# Patient Record
Sex: Female | Born: 1970
Health system: Southern US, Community
[De-identification: ages and names within clinical notes are randomized; demographics above are authoritative.]

## PROBLEM LIST (undated history)

## (undated) DIAGNOSIS — D239 Other benign neoplasm of skin, unspecified: Secondary | ICD-10-CM

## (undated) DIAGNOSIS — R002 Palpitations: Secondary | ICD-10-CM

## (undated) DIAGNOSIS — R011 Cardiac murmur, unspecified: Secondary | ICD-10-CM

## (undated) DIAGNOSIS — T7840XA Allergy, unspecified, initial encounter: Secondary | ICD-10-CM

## (undated) DIAGNOSIS — J45909 Unspecified asthma, uncomplicated: Secondary | ICD-10-CM

## (undated) HISTORY — DX: Allergy, unspecified, initial encounter: T78.40XA

## (undated) HISTORY — DX: Cardiac murmur, unspecified: R01.1

## (undated) HISTORY — DX: Other benign neoplasm of skin, unspecified: D23.9

## (undated) HISTORY — DX: Palpitations: R00.2

## (undated) HISTORY — DX: Unspecified asthma, uncomplicated: J45.909

## (undated) HISTORY — PX: REDUCTION MAMMAPLASTY: SUR839

---

## 1997-04-17 HISTORY — PX: CHOLECYSTECTOMY: SHX55

## 1998-04-17 HISTORY — PX: BREAST SURGERY: SHX581

## 2009-02-16 ENCOUNTER — Ambulatory Visit (HOSPITAL_COMMUNITY): Admission: RE | Admit: 2009-02-16 | Discharge: 2009-02-16 | Payer: Self-pay | Admitting: Gynecology

## 2010-04-17 HISTORY — PX: LIPOSUCTION: SHX10

## 2010-10-04 ENCOUNTER — Ambulatory Visit: Payer: Self-pay | Admitting: Surgery

## 2011-01-10 ENCOUNTER — Ambulatory Visit: Payer: Self-pay | Admitting: Surgery

## 2014-11-23 ENCOUNTER — Encounter (INDEPENDENT_AMBULATORY_CARE_PROVIDER_SITE_OTHER): Payer: Self-pay

## 2014-11-23 ENCOUNTER — Encounter: Payer: Self-pay | Admitting: Family Medicine

## 2014-11-23 ENCOUNTER — Ambulatory Visit (INDEPENDENT_AMBULATORY_CARE_PROVIDER_SITE_OTHER): Payer: BLUE CROSS/BLUE SHIELD | Admitting: Family Medicine

## 2014-11-23 VITALS — BP 111/72 | HR 88 | Temp 98.0°F | Resp 17 | Ht 66.0 in | Wt 145.8 lb

## 2014-11-23 DIAGNOSIS — R0982 Postnasal drip: Secondary | ICD-10-CM | POA: Diagnosis not present

## 2014-11-23 DIAGNOSIS — Z13220 Encounter for screening for lipoid disorders: Secondary | ICD-10-CM | POA: Diagnosis not present

## 2014-11-23 DIAGNOSIS — I493 Ventricular premature depolarization: Secondary | ICD-10-CM | POA: Diagnosis not present

## 2014-11-23 DIAGNOSIS — J309 Allergic rhinitis, unspecified: Secondary | ICD-10-CM | POA: Insufficient documentation

## 2014-11-23 DIAGNOSIS — Z111 Encounter for screening for respiratory tuberculosis: Secondary | ICD-10-CM

## 2014-11-23 DIAGNOSIS — H60542 Acute eczematoid otitis externa, left ear: Secondary | ICD-10-CM | POA: Diagnosis not present

## 2014-11-23 DIAGNOSIS — J45909 Unspecified asthma, uncomplicated: Secondary | ICD-10-CM | POA: Diagnosis not present

## 2014-11-23 MED ORDER — ACETIC ACID 2 % OT SOLN: 4.0000 [drp] | Freq: Three times a day (TID) | OTIC | Status: DC

## 2014-11-23 MED ORDER — TUBERCULIN PPD 5 UNIT/0.1ML ID SOLN
5.0000 [IU] | Freq: Once | INTRADERMAL | Status: AC
  Administered 2014-11-23: 5 [IU] via INTRADERMAL

## 2014-11-23 NOTE — Patient Instructions (Signed)
Premature Ventricular Contraction Premature ventricular contraction (PVC) is an irregularity of the heart rhythm involving extra or skipped heartbeats. In some cases, they may occur without obvious cause or heart disease. Other times, they can be caused by an electrolyte change in the blood. These need to be corrected. They can also be seen when there is not enough oxygen going to the heart. A common cause of this is plaque or cholesterol buildup. This buildup decreases the blood supply to the heart. In addition, extra beats may be caused or aggravated by:  Excessive smoking.  Alcohol consumption.  Caffeine.  Certain medications  Some street drugs. SYMPTOMS   The sensation of feeling your heart skipping a beat (palpitations).  In many cases, the person may have no symptoms. SIGNS AND TESTS   A physical examination may show an occasional irregularity, but if the PVC beats do not happen often, they may not be found on physical exam.  Blood pressure is usually normal.  Other tests that may find extra beats of the heart are:  An EKG (electrocardiogram)  A Holter monitor which can monitor your heart over longer periods of time  An Angiogram (study of the heart arteries). TREATMENT  Usually extra heartbeats do not need treatment. The condition is treated only if symptoms are severe or if extra beats are very frequent or are causing problems. An underlying cause, if discovered, may also require treatment.  Treatment may also be needed if there may be a risk for other more serious cardiac arrhythmias.  PREVENTION   Moderation in caffeine, alcohol, and tobacco use may reduce the risk of ectopic heartbeats in some people.  Exercise often helps people who lead a sedentary (inactive) lifestyle. PROGNOSIS  PVC heartbeats are generally harmless and do not need treatment.  RISKS AND COMPLICATIONS   Ventricular tachycardia (occasionally).  There usually are no complications.  Other  arrhythmias (occasionally). SEEK IMMEDIATE MEDICAL CARE IF:   You feel palpitations that are frequent or continual.  You develop chest pain or other problems such as shortness of breath, sweating, or nausea and vomiting.  You become light-headed or faint (pass out).  You get worse or do not improve with treatment. Document Released: 11/19/2003 Document Revised: 06/26/2011 Document Reviewed: 05/31/2007 [facility] Patient Information 2015 [place], [place]. This information is not intended to replace advice given to you by your health care provider. Make sure you discuss any questions you have with your health care provider.

## 2014-11-23 NOTE — Progress Notes (Signed)
Name: Erica Weber   MRN: [id number]    DOB: [birth year]   Date:11/23/2014       Progress Note  Subjective  Chief Complaint  Chief Complaint  Patient presents with  . Establish Care    NP  . Immunizations    TB Shot  . Asthma    HPI  Erica Weber is a pleasant 44 year old female who is here today to establish care for primary care services. She routinely sees a gynecologist and was urged by her specialist to discuss her cardiac findings.  Around 2012 had cardiology evaluation for asymptomatic irregular heart rhythm heard on exam, normal findings other than occasional PVCs which required no intervention at the time.  Again her gynecologist noted concern for irregular rhythm heard on exam recent. [name] denies chest pain, palpitations, SOB, dizziness, syncope, left arm numbness. She has an active lifestyle, exercises regularly with no limitations and eats a balanced diet. She does have a family history of strokes in her father as well as early MIs in her paternal grandparent(s).    In addition to this she is managing her allergic symptoms of sinus pressure, ear fulness with congestion well with daily claritin and singulair. She uses her flonase when she remembers to. Was using a lot of decongestants over the counter but due to heart rhythm she has really cut back as well as reduced her intake of caffeine.   Active Ambulatory Problems    Diagnosis Date Noted  . Asymptomatic PVCs 11/23/2014  . Allergic rhinitis with postnasal drip 11/23/2014  . Eczema of left external ear 11/23/2014  . Screening cholesterol level 11/23/2014  . Screening for tuberculosis 11/23/2014   Resolved Ambulatory Problems    Diagnosis Date Noted  . No Resolved Ambulatory Problems   Past Medical History  Diagnosis Date  . Allergy   . Asthma   . Heart murmur     History  Substance Use Topics  . Smoking status: Never Smoker   . Smokeless tobacco: Never Used  . Alcohol Use: 1.8 - 2.4 oz/week    3-4 Standard  drinks or equivalent per week     Current outpatient prescriptions:  .  Cetirizine HCl (ZYRTEC PO), Take 1 tablet by mouth at bedtime., Disp: , Rfl:  .  Montelukast Sodium (SINGULAIR PO), Take 1 tablet by mouth daily., Disp: , Rfl:  .  acetic acid (VOSOL) 2 % otic solution, Place 4 drops into the left ear 3 (three) times daily., Disp: 15 mL, Rfl: 1  Current facility-administered medications:  .  tuberculin injection 5 Units, 5 Units, Intradermal, Once, Bobetta Lime, MD, 5 Units at 11/23/14 1121  Past Surgical History  Procedure Laterality Date  . Cholecystectomy  1999  . Breast surgery Bilateral 2000    Breast Reduction  . Liposuction  2012    Fat deposit in back    Family History  Problem Relation Age of Onset  . Cancer Mother     lung  . Diabetes Mother   . Stroke Father   . Hyperlipidemia Father   . Down syndrome Son     No Known Allergies   Review of Systems  CONSTITUTIONAL: No significant weight changes, fever, chills, weakness or fatigue.  HEENT:  - Eyes: No visual changes.  - Ears: No auditory changes. No pain.  - Nose: No sneezing, congestion, runny nose. - Throat: No sore throat. No changes in swallowing. SKIN: No rash or itching.  CARDIOVASCULAR: No chest pain, chest pressure or chest  discomfort. No palpitations or edema.  RESPIRATORY: No shortness of breath, cough or sputum.  GASTROINTESTINAL: No anorexia, nausea, vomiting. No changes in bowel habits. No abdominal pain or blood.  GENITOURINARY: No dysuria. No frequency. No discharge.  NEUROLOGICAL: No headache, dizziness, syncope, paralysis, ataxia, numbness or tingling in the extremities. No memory changes. No change in bowel or bladder control.  MUSCULOSKELETAL: No joint pain. No muscle pain. HEMATOLOGIC: No anemia, bleeding or bruising.  LYMPHATICS: No enlarged lymph nodes.  PSYCHIATRIC: No change in mood. No change in sleep pattern.  ENDOCRINOLOGIC: No reports of sweating, cold or heat  intolerance. No polyuria or polydipsia.     Objective  BP 111/72 mmHg  Pulse 88  Temp(Src) 98 F (36.7 C) (Oral)  Resp 17  Ht 5\' 6"  (1.676 m)  Wt 145 lb 12.8 oz (66.134 kg)  BMI 23.54 kg/m2  SpO2 97%  LMP 11/19/2014 (Exact Date) Body mass index is 23.54 kg/(m^2).  Physical Exam  Constitutional: Patient appears well-developed and well-nourished. In no distress.  HEENT:  - Head: Normocephalic and atraumatic.  - Ears: Bilateral TMs gray, no erythema or effusion. Left external canal with scaling of skin. - Nose: Nasal mucosa moist - Mouth/Throat: Oropharynx is clear and moist. No tonsillar hypertrophy or erythema. No post nasal drainage.  - Eyes: Conjunctivae clear, EOM movements normal. PERRLA. No scleral icterus.  Neck: Normal range of motion. Neck supple. No JVD present. No thyromegaly present.  Cardiovascular: Normal rate, no murmurs. Rhythm however changes about every 3rd beat.  Pulmonary/Chest: Effort normal and breath sounds normal. No respiratory distress. Musculoskeletal: Normal range of motion bilateral UE and LE, no joint effusions. Peripheral vascular: Bilateral LE no edema. Neurological: CN II-XII grossly intact with no focal deficits. Alert and oriented to person, place, and time. Coordination, balance, strength, speech and gait are normal.  Skin: Skin is warm and dry. No rash noted. No erythema.  Psychiatric: Patient has a normal mood and affect. Behavior is normal in office today. Judgment and thought content normal in office today.   Assessment & Plan  1. Asymptomatic PVCs Cardiovascular risk factors minimal however EKG confirms frequent PVCs about every 3rd to 4th beat with T wave inversion in V1 and V2. Will start with blood work to rule out anemia, thyroid disorder, electrolyte imbalance and consult with cardiologist again, Dr. Ubaldo Glassing at [facility] has seen her several years ago.  - EKG 12-Lead - CBC with Differential/Platelet - Comprehensive  metabolic panel - TSH - T3, free - T4, free - Magnesium  2. Allergic rhinitis with postnasal drip Continue antihistamine and singulair.  3. Eczema of left external ear Trial of acetic acid solution to external ear canal.  - acetic acid (VOSOL) 2 % otic solution; Place 4 drops into the left ear 3 (three) times daily.  Dispense: 15 mL; Refill: 1  4. Screening cholesterol level  - Lipid panel  5. Screening for tuberculosis PPD placed, RTC 48hrs for reading.  - Tuberculin injection 5 units

## 2014-11-25 ENCOUNTER — Telehealth: Payer: Self-pay | Admitting: Family Medicine

## 2014-11-25 ENCOUNTER — Other Ambulatory Visit: Payer: Self-pay

## 2014-11-25 NOTE — Telephone Encounter (Signed)
Patient was prescribed Vosol. The pharmacy had to order the medication, however the wrong one was ordered. They ordered Vosol HC. Erica Weber would like to know if it is okay for them to give there the [facility] or if she has to have the regular Vosol. Please advise [phone]

## 2014-11-26 LAB — CBC WITH DIFFERENTIAL/PLATELET
BASOS ABS: 0 10*3/uL (ref 0.0–0.2)
Basos: 0 %
EOS (ABSOLUTE): 0.1 10*3/uL (ref 0.0–0.4)
Eos: 2 %
Hematocrit: 39.4 % (ref 34.0–46.6)
Hemoglobin: 13.6 g/dL (ref 11.1–15.9)
IMMATURE GRANS (ABS): 0 10*3/uL (ref 0.0–0.1)
IMMATURE GRANULOCYTES: 0 %
LYMPHS: 26 %
Lymphocytes Absolute: 1.5 10*3/uL (ref 0.7–3.1)
MCH: 31 pg (ref 26.6–33.0)
MCHC: 34.5 g/dL (ref 31.5–35.7)
MCV: 90 fL (ref 79–97)
Monocytes Absolute: 0.6 10*3/uL (ref 0.1–0.9)
Monocytes: 10 %
NEUTROS PCT: 62 %
Neutrophils Absolute: 3.5 10*3/uL (ref 1.4–7.0)
PLATELETS: 215 10*3/uL (ref 150–379)
RBC: 4.39 x10E6/uL (ref 3.77–5.28)
RDW: 13.2 % (ref 12.3–15.4)
WBC: 5.7 10*3/uL (ref 3.4–10.8)

## 2014-11-26 LAB — T4, FREE: Free T4: 1.16 ng/dL (ref 0.82–1.77)

## 2014-11-26 LAB — COMPREHENSIVE METABOLIC PANEL
ALK PHOS: 57 IU/L (ref 39–117)
ALT: 11 IU/L (ref 0–32)
AST: 17 IU/L (ref 0–40)
Albumin/Globulin Ratio: 2.6 — ABNORMAL HIGH (ref 1.1–2.5)
Albumin: 4.6 g/dL (ref 3.5–5.5)
BILIRUBIN TOTAL: 0.5 mg/dL (ref 0.0–1.2)
BUN / CREAT RATIO: 20 (ref 9–23)
BUN: 13 mg/dL (ref 6–24)
CHLORIDE: 100 mmol/L (ref 97–108)
CO2: 26 mmol/L (ref 18–29)
Calcium: 9.3 mg/dL (ref 8.7–10.2)
Creatinine, Ser: 0.64 mg/dL (ref 0.57–1.00)
GFR, EST AFRICAN AMERICAN: 125 mL/min/{1.73_m2} (ref >59)
GFR, EST NON AFRICAN AMERICAN: 108 mL/min/{1.73_m2} (ref >59)
Globulin, Total: 1.8 g/dL (ref 1.5–4.5)
Glucose: 85 mg/dL (ref 65–99)
POTASSIUM: 4.1 mmol/L (ref 3.5–5.2)
Sodium: 140 mmol/L (ref 134–144)
Total Protein: 6.4 g/dL (ref 6.0–8.5)

## 2014-11-26 LAB — LIPID PANEL
CHOLESTEROL TOTAL: 142 mg/dL (ref 100–199)
Chol/HDL Ratio: 2.6 ratio units (ref 0.0–4.4)
HDL: 55 mg/dL (ref >39)
LDL Calculated: 73 mg/dL (ref 0–99)
Triglycerides: 71 mg/dL (ref 0–149)
VLDL CHOLESTEROL CAL: 14 mg/dL (ref 5–40)

## 2014-11-26 LAB — T3, FREE: T3, Free: 3.1 pg/mL (ref 2.0–4.4)

## 2014-11-26 LAB — TSH: TSH: 2.52 u[IU]/mL (ref 0.450–4.500)

## 2014-11-26 LAB — MAGNESIUM: Magnesium: 1.9 mg/dL (ref 1.6–2.3)

## 2014-11-26 NOTE — Telephone Encounter (Signed)
Yes it is okay to use the Vosol HC instead of the Vosol.

## 2014-11-27 NOTE — Telephone Encounter (Signed)
Pharmacy informed.

## 2014-12-01 DIAGNOSIS — R0789 Other chest pain: Secondary | ICD-10-CM | POA: Insufficient documentation

## 2014-12-01 DIAGNOSIS — Z87898 Personal history of other specified conditions: Secondary | ICD-10-CM | POA: Insufficient documentation

## 2015-07-22 DIAGNOSIS — J3089 Other allergic rhinitis: Secondary | ICD-10-CM | POA: Diagnosis not present

## 2015-07-22 DIAGNOSIS — J301 Allergic rhinitis due to pollen: Secondary | ICD-10-CM | POA: Diagnosis not present

## 2015-07-27 DIAGNOSIS — J301 Allergic rhinitis due to pollen: Secondary | ICD-10-CM | POA: Diagnosis not present

## 2015-07-27 DIAGNOSIS — J3089 Other allergic rhinitis: Secondary | ICD-10-CM | POA: Diagnosis not present

## 2015-08-05 DIAGNOSIS — J3089 Other allergic rhinitis: Secondary | ICD-10-CM | POA: Diagnosis not present

## 2015-08-05 DIAGNOSIS — J301 Allergic rhinitis due to pollen: Secondary | ICD-10-CM | POA: Diagnosis not present

## 2015-08-10 DIAGNOSIS — J301 Allergic rhinitis due to pollen: Secondary | ICD-10-CM | POA: Diagnosis not present

## 2015-08-10 DIAGNOSIS — J3089 Other allergic rhinitis: Secondary | ICD-10-CM | POA: Diagnosis not present

## 2015-08-17 DIAGNOSIS — J301 Allergic rhinitis due to pollen: Secondary | ICD-10-CM | POA: Diagnosis not present

## 2015-08-17 DIAGNOSIS — J3089 Other allergic rhinitis: Secondary | ICD-10-CM | POA: Diagnosis not present

## 2015-08-26 DIAGNOSIS — J3089 Other allergic rhinitis: Secondary | ICD-10-CM | POA: Diagnosis not present

## 2015-08-26 DIAGNOSIS — J301 Allergic rhinitis due to pollen: Secondary | ICD-10-CM | POA: Diagnosis not present

## 2015-09-06 DIAGNOSIS — J301 Allergic rhinitis due to pollen: Secondary | ICD-10-CM | POA: Diagnosis not present

## 2015-09-06 DIAGNOSIS — J3089 Other allergic rhinitis: Secondary | ICD-10-CM | POA: Diagnosis not present

## 2015-09-16 DIAGNOSIS — J3089 Other allergic rhinitis: Secondary | ICD-10-CM | POA: Diagnosis not present

## 2015-09-16 DIAGNOSIS — J301 Allergic rhinitis due to pollen: Secondary | ICD-10-CM | POA: Diagnosis not present

## 2015-09-22 DIAGNOSIS — J3089 Other allergic rhinitis: Secondary | ICD-10-CM | POA: Diagnosis not present

## 2015-09-22 DIAGNOSIS — J301 Allergic rhinitis due to pollen: Secondary | ICD-10-CM | POA: Diagnosis not present

## 2015-10-07 DIAGNOSIS — J301 Allergic rhinitis due to pollen: Secondary | ICD-10-CM | POA: Diagnosis not present

## 2015-10-07 DIAGNOSIS — J3089 Other allergic rhinitis: Secondary | ICD-10-CM | POA: Diagnosis not present

## 2015-10-14 DIAGNOSIS — J301 Allergic rhinitis due to pollen: Secondary | ICD-10-CM | POA: Diagnosis not present

## 2015-10-14 DIAGNOSIS — J3089 Other allergic rhinitis: Secondary | ICD-10-CM | POA: Diagnosis not present

## 2015-11-12 DIAGNOSIS — J3089 Other allergic rhinitis: Secondary | ICD-10-CM | POA: Diagnosis not present

## 2015-11-12 DIAGNOSIS — J301 Allergic rhinitis due to pollen: Secondary | ICD-10-CM | POA: Diagnosis not present

## 2015-11-18 DIAGNOSIS — J301 Allergic rhinitis due to pollen: Secondary | ICD-10-CM | POA: Diagnosis not present

## 2015-11-18 DIAGNOSIS — J3089 Other allergic rhinitis: Secondary | ICD-10-CM | POA: Diagnosis not present

## 2015-12-23 DIAGNOSIS — J3089 Other allergic rhinitis: Secondary | ICD-10-CM | POA: Diagnosis not present

## 2015-12-23 DIAGNOSIS — J301 Allergic rhinitis due to pollen: Secondary | ICD-10-CM | POA: Diagnosis not present

## 2015-12-30 DIAGNOSIS — J301 Allergic rhinitis due to pollen: Secondary | ICD-10-CM | POA: Diagnosis not present

## 2015-12-30 DIAGNOSIS — J3089 Other allergic rhinitis: Secondary | ICD-10-CM | POA: Diagnosis not present

## 2016-01-06 DIAGNOSIS — J301 Allergic rhinitis due to pollen: Secondary | ICD-10-CM | POA: Diagnosis not present

## 2016-01-06 DIAGNOSIS — J3089 Other allergic rhinitis: Secondary | ICD-10-CM | POA: Diagnosis not present

## 2016-01-27 DIAGNOSIS — J301 Allergic rhinitis due to pollen: Secondary | ICD-10-CM | POA: Diagnosis not present

## 2016-01-27 DIAGNOSIS — J3089 Other allergic rhinitis: Secondary | ICD-10-CM | POA: Diagnosis not present

## 2016-02-10 DIAGNOSIS — J301 Allergic rhinitis due to pollen: Secondary | ICD-10-CM | POA: Diagnosis not present

## 2016-02-10 DIAGNOSIS — J3089 Other allergic rhinitis: Secondary | ICD-10-CM | POA: Diagnosis not present

## 2016-02-17 ENCOUNTER — Encounter: Payer: BLUE CROSS/BLUE SHIELD | Admitting: Family Medicine

## 2016-02-17 DIAGNOSIS — Z1231 Encounter for screening mammogram for malignant neoplasm of breast: Secondary | ICD-10-CM | POA: Diagnosis not present

## 2016-02-17 DIAGNOSIS — Z01419 Encounter for gynecological examination (general) (routine) without abnormal findings: Secondary | ICD-10-CM | POA: Diagnosis not present

## 2016-02-17 DIAGNOSIS — Z6824 Body mass index (BMI) 24.0-24.9, adult: Secondary | ICD-10-CM | POA: Diagnosis not present

## 2016-02-23 ENCOUNTER — Other Ambulatory Visit: Payer: Self-pay | Admitting: Obstetrics & Gynecology

## 2016-02-23 DIAGNOSIS — R928 Other abnormal and inconclusive findings on diagnostic imaging of breast: Secondary | ICD-10-CM

## 2016-02-24 DIAGNOSIS — J301 Allergic rhinitis due to pollen: Secondary | ICD-10-CM | POA: Diagnosis not present

## 2016-02-24 DIAGNOSIS — J3089 Other allergic rhinitis: Secondary | ICD-10-CM | POA: Diagnosis not present

## 2016-03-02 ENCOUNTER — Ambulatory Visit
Admission: RE | Admit: 2016-03-02 | Discharge: 2016-03-02 | Disposition: A | Discharge status: Home / Self Care | Payer: BLUE CROSS/BLUE SHIELD | Source: Ambulatory Visit | Attending: Obstetrics & Gynecology | Admitting: Obstetrics & Gynecology

## 2016-03-02 ENCOUNTER — Other Ambulatory Visit: Payer: Self-pay | Admitting: Obstetrics & Gynecology

## 2016-03-02 DIAGNOSIS — R928 Other abnormal and inconclusive findings on diagnostic imaging of breast: Secondary | ICD-10-CM

## 2016-03-02 DIAGNOSIS — J3089 Other allergic rhinitis: Secondary | ICD-10-CM | POA: Diagnosis not present

## 2016-03-02 DIAGNOSIS — N6323 Unspecified lump in the left breast, lower outer quadrant: Secondary | ICD-10-CM | POA: Diagnosis not present

## 2016-03-02 DIAGNOSIS — R921 Mammographic calcification found on diagnostic imaging of breast: Secondary | ICD-10-CM | POA: Diagnosis not present

## 2016-03-02 DIAGNOSIS — J301 Allergic rhinitis due to pollen: Secondary | ICD-10-CM | POA: Diagnosis not present

## 2016-03-06 ENCOUNTER — Ambulatory Visit
Admission: RE | Admit: 2016-03-06 | Discharge: 2016-03-06 | Disposition: A | Discharge status: Home / Self Care | Payer: BLUE CROSS/BLUE SHIELD | Source: Ambulatory Visit | Attending: Obstetrics & Gynecology | Admitting: Obstetrics & Gynecology

## 2016-03-06 ENCOUNTER — Other Ambulatory Visit: Payer: Self-pay | Admitting: Obstetrics & Gynecology

## 2016-03-06 DIAGNOSIS — N6011 Diffuse cystic mastopathy of right breast: Secondary | ICD-10-CM | POA: Diagnosis not present

## 2016-03-06 DIAGNOSIS — R928 Other abnormal and inconclusive findings on diagnostic imaging of breast: Secondary | ICD-10-CM

## 2016-03-06 DIAGNOSIS — R921 Mammographic calcification found on diagnostic imaging of breast: Secondary | ICD-10-CM | POA: Diagnosis not present

## 2016-03-06 HISTORY — PX: BREAST BIOPSY: SHX20

## 2016-03-16 DIAGNOSIS — J3081 Allergic rhinitis due to animal (cat) (dog) hair and dander: Secondary | ICD-10-CM | POA: Diagnosis not present

## 2016-03-16 DIAGNOSIS — J3089 Other allergic rhinitis: Secondary | ICD-10-CM | POA: Diagnosis not present

## 2016-03-16 DIAGNOSIS — J301 Allergic rhinitis due to pollen: Secondary | ICD-10-CM | POA: Diagnosis not present

## 2016-03-16 DIAGNOSIS — L309 Dermatitis, unspecified: Secondary | ICD-10-CM | POA: Diagnosis not present

## 2016-03-17 ENCOUNTER — Encounter: Payer: BLUE CROSS/BLUE SHIELD | Admitting: Family Medicine

## 2016-03-28 DIAGNOSIS — J301 Allergic rhinitis due to pollen: Secondary | ICD-10-CM | POA: Diagnosis not present

## 2016-03-28 DIAGNOSIS — J3089 Other allergic rhinitis: Secondary | ICD-10-CM | POA: Diagnosis not present

## 2016-03-30 DIAGNOSIS — J301 Allergic rhinitis due to pollen: Secondary | ICD-10-CM | POA: Diagnosis not present

## 2016-04-04 DIAGNOSIS — J3089 Other allergic rhinitis: Secondary | ICD-10-CM | POA: Diagnosis not present

## 2016-04-04 DIAGNOSIS — J301 Allergic rhinitis due to pollen: Secondary | ICD-10-CM | POA: Diagnosis not present

## 2016-04-06 ENCOUNTER — Encounter: Payer: Self-pay | Admitting: Family Medicine

## 2016-04-06 ENCOUNTER — Ambulatory Visit (INDEPENDENT_AMBULATORY_CARE_PROVIDER_SITE_OTHER): Payer: BLUE CROSS/BLUE SHIELD | Admitting: Family Medicine

## 2016-04-06 VITALS — BP 110/72 | HR 79 | Temp 98.0°F | Resp 16 | Ht 66.0 in | Wt 152.0 lb

## 2016-04-06 DIAGNOSIS — Z111 Encounter for screening for respiratory tuberculosis: Secondary | ICD-10-CM

## 2016-04-06 DIAGNOSIS — Z0000 Encounter for general adult medical examination without abnormal findings: Secondary | ICD-10-CM

## 2016-04-06 DIAGNOSIS — R5383 Other fatigue: Secondary | ICD-10-CM | POA: Diagnosis not present

## 2016-04-06 DIAGNOSIS — Z114 Encounter for screening for human immunodeficiency virus [HIV]: Secondary | ICD-10-CM

## 2016-04-06 DIAGNOSIS — Z23 Encounter for immunization: Secondary | ICD-10-CM | POA: Diagnosis not present

## 2016-04-06 LAB — CBC WITH DIFFERENTIAL/PLATELET
BASOS PCT: 0 %
Basophils Absolute: 0 cells/uL (ref 0–200)
EOS ABS: 72 {cells}/uL (ref 15–500)
EOS PCT: 1 %
HCT: 42.4 % (ref 35.0–45.0)
Hemoglobin: 14.1 g/dL (ref 11.7–15.5)
LYMPHS PCT: 20 %
Lymphs Abs: 1440 cells/uL (ref 850–3900)
MCH: 30.8 pg (ref 27.0–33.0)
MCHC: 33.3 g/dL (ref 32.0–36.0)
MCV: 92.6 fL (ref 80.0–100.0)
MONOS PCT: 8 %
MPV: 9.6 fL (ref 7.5–12.5)
Monocytes Absolute: 576 cells/uL (ref 200–950)
NEUTROS ABS: 5112 {cells}/uL (ref 1500–7800)
Neutrophils Relative %: 71 %
PLATELETS: 250 10*3/uL (ref 140–400)
RBC: 4.58 MIL/uL (ref 3.80–5.10)
RDW: 13.3 % (ref 11.0–15.0)
WBC: 7.2 10*3/uL (ref 3.8–10.8)

## 2016-04-06 LAB — TSH: TSH: 1.62 mIU/L

## 2016-04-06 LAB — LIPID PANEL
CHOL/HDL RATIO: 2.9 ratio (ref <5.0)
Cholesterol: 150 mg/dL (ref <200)
HDL: 51 mg/dL (ref >50)
LDL CALC: 74 mg/dL (ref <100)
Triglycerides: 123 mg/dL (ref <150)
VLDL: 25 mg/dL (ref <30)

## 2016-04-06 LAB — COMPLETE METABOLIC PANEL WITH GFR
ALBUMIN: 4.3 g/dL (ref 3.6–5.1)
ALK PHOS: 44 U/L (ref 33–115)
ALT: 11 U/L (ref 6–29)
AST: 18 U/L (ref 10–35)
BUN: 8 mg/dL (ref 7–25)
CHLORIDE: 100 mmol/L (ref 98–110)
CO2: 27 mmol/L (ref 20–31)
Calcium: 9.5 mg/dL (ref 8.6–10.2)
Creat: 0.64 mg/dL (ref 0.50–1.10)
GFR, Est African American: >89 mL/min (ref >=60)
GLUCOSE: 80 mg/dL (ref 65–99)
POTASSIUM: 5.1 mmol/L (ref 3.5–5.3)
SODIUM: 137 mmol/L (ref 135–146)
Total Bilirubin: 0.5 mg/dL (ref 0.2–1.2)
Total Protein: 6.8 g/dL (ref 6.1–8.1)

## 2016-04-06 LAB — VITAMIN B12: VITAMIN B 12: 278 pg/mL (ref 200–1100)

## 2016-04-06 NOTE — Progress Notes (Signed)
Patient ID: Erica Weber, female   DOB: 1970-10-25, 45 y.o.   MRN: [id number]   Subjective:   Erica Weber is a 45 y.o. female here for a complete physical exam  Interim issues since last visit: she saw a heart doctor Her GYN heard some skipped beats; PVC; had a heart murmur and used to have to premedicate for dental procedures; really bad allergies, used to take Sudafed; did EKG; saw Dr. Ubaldo Glassing and happened again, had coffee and it happened again; had a stress test; disappear when she exercises; not on any medicines; half a cup of coffee at times, does not stress her out  USPSTF grade A and B recommendations Alcohol: social Depression:  Depression screen [facility] 2/9 04/06/2016 11/23/2014  Decreased Interest 0 0  Down, Depressed, Hopeless 0 0  PHQ - 2 Score 0 0   Hypertension: excellent Obesity: no Tobacco use: remote never HIV, hep B, hep C: get today STD testing and prevention (chl/gon/syphilis): declined Lipids: not fasting, little Debbie christmas cake Glucose: nonfasting Colorectal cancer: no first degrees Breast cancer: per GYN BRCA gene screening: per GYN Intimate partner violence: no Cervical cancer screening: per GYN Lung cancer: no Osteoporosis: no Fall prevention/vitamin D: wants to check AAA: n/a Aspirin: n/a Diet: a little better than typical [place], not fast food, very little red meat Exercise: trying Skin cancer: no worrisome moles; would get them checked yearly; has a spot on shoulder; had a few dysplastic nevi, mild dysplasia, did wider resection; avoid tanning, wears sunscreen Tired and dry skin  Past Medical History:  Diagnosis Date  . Allergy   . Asthma   . Heart murmur    Past Surgical History:  Procedure Laterality Date  . BREAST SURGERY Bilateral 2000   Breast Reduction  . CHOLECYSTECTOMY  1999  . LIPOSUCTION  2012   Fat deposit in back  MD note: had lipoma resection, not liposuction  Family History  Problem Relation Age of Onset  . Cancer  Mother     lung  . Diabetes Mother   . Stroke Father   . Hyperlipidemia Father   . Down syndrome Son    Social History  Substance Use Topics  . Smoking status: Never Smoker  . Smokeless tobacco: Never Used  . Alcohol use 1.8 - 2.4 oz/week    3 - 4 Standard drinks or equivalent per week   Review of Systems  Constitutional: Positive for fatigue and unexpected weight change.   Objective:   Vitals:   04/06/16 1114  BP: 110/72  Pulse: 79  Resp: 16  Temp: 98 F (36.7 C)  TempSrc: Oral  SpO2: 99%  Weight: 152 lb (68.9 kg)  Height: 5' 6"  (1.676 m)   Body mass index is 24.53 kg/m. Wt Readings from Last 3 Encounters:  04/06/16 152 lb (68.9 kg)  11/23/14 145 lb 12.8 oz (66.1 kg)   Physical Exam  Constitutional: She appears well-developed and well-nourished.  HENT:  Head: Normocephalic and atraumatic.  Right Ear: Hearing, tympanic membrane, external ear and ear canal normal.  Left Ear: Hearing, tympanic membrane, external ear and ear canal normal.  Eyes: Conjunctivae and EOM are normal. Right eye exhibits no hordeolum. Left eye exhibits no hordeolum. No scleral icterus.  Neck: Carotid bruit is not present. No thyromegaly present.  Cardiovascular: Normal rate, regular rhythm, S1 normal, S2 normal and normal heart sounds.   No extrasystoles are present.  Pulmonary/Chest: Effort normal and breath sounds normal. No respiratory distress.  Abdominal: Soft. Normal  appearance and bowel sounds are normal. She exhibits no distension, no abdominal bruit, no pulsatile midline mass and no mass. There is no hepatosplenomegaly. There is no tenderness. No hernia.  Musculoskeletal: Normal range of motion. She exhibits no edema.  Lymphadenopathy:       Head (right side): No submandibular adenopathy present.       Head (left side): No submandibular adenopathy present.    She has no cervical adenopathy.  Neurological: She is alert. She displays no tremor. No cranial nerve deficit. She exhibits  normal muscle tone. Gait normal.  Reflex Scores:      Patellar reflexes are 2+ on the right side and 2+ on the left side. Skin: Skin is warm and dry. No bruising and no ecchymosis noted. She is not diaphoretic. No cyanosis. No pallor.  Psychiatric: Her speech is normal and behavior is normal. Thought content normal. Her mood appears not anxious. She does not exhibit a depressed mood.    Assessment/Plan:   Problem List Items Addressed This Visit      Other   Preventative health care - Primary    USPSTF grade A and B recommendations reviewed with patient; age-appropriate recommendations, preventive care, screening tests, etc discussed and encouraged; healthy living encouraged; see AVS for patient education given to patient       Relevant Orders   TSH (Completed)   Lipid panel (Completed)   CBC with Differential/Platelet (Completed)   COMPLETE METABOLIC PANEL WITH GFR (Completed)   Fatigue    Check TSH and vit D      Relevant Orders   Vitamin B12 (Completed)   VITAMIN D 25 Hydroxy (Vit-D Deficiency, Fractures) (Completed)    Other Visit Diagnoses    Needs flu shot       Relevant Orders   Flu Vaccine QUAD 36+ mos PF IM (Fluarix & Fluzone Quad PF) (Completed)   Encounter for screening for HIV       Relevant Orders   HIV antibody (with reflex) (Completed)   Screening-pulmonary TB       Relevant Orders   Quantiferon tb gold assay (Completed)      Meds ordered this encounter  Medications  . fluticasone (FLONASE) 50 MCG/ACT nasal spray    Sig: Place 1 spray into both nostrils daily.  Marland Kitchen loratadine (CLARITIN) 10 MG tablet    Sig: Take 10 mg by mouth daily as needed for allergies.   Orders Placed This Encounter  Procedures  . Flu Vaccine QUAD 36+ mos PF IM (Fluarix & Fluzone Quad PF)  . HIV antibody (with reflex)  . TSH  . Lipid panel  . CBC with Differential/Platelet  . Vitamin B12  . VITAMIN D 25 Hydroxy (Vit-D Deficiency, Fractures)  . COMPLETE METABOLIC PANEL WITH  GFR  . Quantiferon tb gold assay    Follow up plan: Return in about 1 year (around 04/06/2017) for complete physical.  An After Visit Summary was printed and given to the patient.

## 2016-04-06 NOTE — Patient Instructions (Addendum)
Let's get labs today If you have not heard anything from my staff in a week about any orders/referrals/studies from today, please contact us here to follow-up ([phone]) [phone]  Health Maintenance, Female Introduction Adopting a healthy lifestyle and getting preventive care can go a long way to promote health and wellness. Talk with your health care provider about what schedule of regular examinations is right for you. This is a good chance for you to check in with your provider about disease prevention and staying healthy. In between checkups, there are plenty of things you can do on your own. Experts have done a lot of research about which lifestyle changes and preventive measures are most likely to keep you healthy. Ask your health care provider for more information. Weight and diet Eat a healthy diet  Be sure to include plenty of vegetables, fruits, low-fat dairy products, and lean protein.  Do not eat a lot of foods high in solid fats, added sugars, or salt.  Get regular exercise. This is one of the most important things you can do for your health.  Most adults should exercise for at least 150 minutes each week. The exercise should increase your heart rate and make you sweat (moderate-intensity exercise).  Most adults should also do strengthening exercises at least twice a week. This is in addition to the moderate-intensity exercise. Maintain a healthy weight  Body mass index (BMI) is a measurement that can be used to identify possible weight problems. It estimates body fat based on height and weight. Your health care provider can help determine your BMI and help you achieve or maintain a healthy weight.  For females 33 years of age and older:  A BMI below 18.5 is considered underweight.  A BMI of 18.5 to 24.9 is normal.  A BMI of 25 to 29.9 is considered overweight.  A BMI of 30 and above is considered obese. Watch levels of cholesterol and blood lipids  You should start having  your blood tested for lipids and cholesterol at 46 years of age, then have this test every 5 years.  You may need to have your cholesterol levels checked more often if:  Your lipid or cholesterol levels are high.  You are older than 46 years of age.  You are at high risk for heart disease. Cancer screening Lung Cancer  Lung cancer screening is recommended for adults 37-79 years old who are at high risk for lung cancer because of a history of smoking.  A yearly low-dose CT scan of the lungs is recommended for people who:  Currently smoke.  Have quit within the past 15 years.  Have at least a 30-pack-year history of smoking. A pack year is smoking an average of one pack of cigarettes a day for 1 year.  Yearly screening should continue until it has been 15 years since you quit.  Yearly screening should stop if you develop a health problem that would prevent you from having lung cancer treatment. Breast Cancer  Practice breast self-awareness. This means understanding how your breasts normally appear and feel.  It also means doing regular breast self-exams. Let your health care provider know about any changes, no matter how small.  If you are in your 20s or 30s, you should have a clinical breast exam (CBE) by a health care provider every 1-3 years as part of a regular health exam.  If you are 76 or older, have a CBE every year. Also consider having a breast X-ray (mammogram) every  year.  If you have a family history of breast cancer, talk to your health care provider about genetic screening.  If you are at high risk for breast cancer, talk to your health care provider about having an MRI and a mammogram every year.  Breast cancer gene (BRCA) assessment is recommended for women who have family members with BRCA-related cancers. BRCA-related cancers include:  Breast.  Ovarian.  Tubal.  Peritoneal cancers.  Results of the assessment will determine the need for genetic  counseling and BRCA1 and BRCA2 testing. Cervical Cancer  Your health care provider may recommend that you be screened regularly for cancer of the pelvic organs (ovaries, uterus, and vagina). This screening involves a pelvic examination, including checking for microscopic changes to the surface of your cervix (Pap test). You may be encouraged to have this screening done every 3 years, beginning at age 62.  For women ages 54-65, health care providers may recommend pelvic exams and Pap testing every 3 years, or they may recommend the Pap and pelvic exam, combined with testing for human papilloma virus (HPV), every 5 years. Some types of HPV increase your risk of cervical cancer. Testing for HPV may also be done on women of any age with unclear Pap test results.  Other health care providers may not recommend any screening for nonpregnant women who are considered low risk for pelvic cancer and who do not have symptoms. Ask your health care provider if a screening pelvic exam is right for you.  If you have had past treatment for cervical cancer or a condition that could lead to cancer, you need Pap tests and screening for cancer for at least 20 years after your treatment. If Pap tests have been discontinued, your risk factors (such as having a new sexual partner) need to be reassessed to determine if screening should resume. Some women have medical problems that increase the chance of getting cervical cancer. In these cases, your health care provider may recommend more frequent screening and Pap tests. Colorectal Cancer  This type of cancer can be detected and often prevented.  Routine colorectal cancer screening usually begins at 45 years of age and continues through 46 years of age.  Your health care provider may recommend screening at an earlier age if you have risk factors for colon cancer.  Your health care provider may also recommend using home test kits to check for hidden blood in the stool.  A  small camera at the end of a tube can be used to examine your colon directly (sigmoidoscopy or colonoscopy). This is done to check for the earliest forms of colorectal cancer.  Routine screening usually begins at age 34.  Direct examination of the colon should be repeated every 5-10 years through 46 years of age. However, you may need to be screened more often if early forms of precancerous polyps or small growths are found. Skin Cancer  Check your skin from head to toe regularly.  Tell your health care provider about any new moles or changes in moles, especially if there is a change in a mole's shape or color.  Also tell your health care provider if you have a mole that is larger than the size of a pencil eraser.  Always use sunscreen. Apply sunscreen liberally and repeatedly throughout the day.  Protect yourself by wearing long sleeves, pants, a wide-brimmed hat, and sunglasses whenever you are outside. Heart disease, diabetes, and high blood pressure  High blood pressure causes heart disease and increases  the risk of stroke. High blood pressure is more likely to develop in:  People who have blood pressure in the high end of the normal range (130-139/85-89 mm Hg).  People who are overweight or obese.  People who are African American.  If you are 27-27 years of age, have your blood pressure checked every 3-5 years. If you are 94 years of age or older, have your blood pressure checked every year. You should have your blood pressure measured twice-once when you are at a hospital or clinic, and once when you are not at a hospital or clinic. Record the average of the two measurements. To check your blood pressure when you are not at a hospital or clinic, you can use:  An automated blood pressure machine at a pharmacy.  A home blood pressure monitor.  If you are between 50 years and 38 years old, ask your health care provider if you should take aspirin to prevent strokes.  Have regular  diabetes screenings. This involves taking a blood sample to check your fasting blood sugar level.  If you are at a normal weight and have a low risk for diabetes, have this test once every three years after 46 years of age.  If you are overweight and have a high risk for diabetes, consider being tested at a younger age or more often. Preventing infection Hepatitis B  If you have a higher risk for hepatitis B, you should be screened for this virus. You are considered at high risk for hepatitis B if:  You were born in a country where hepatitis B is common. Ask your health care provider which countries are considered high risk.  Your parents were born in a high-risk country, and you have not been immunized against hepatitis B (hepatitis B vaccine).  You have HIV or AIDS.  You use needles to inject street drugs.  You live with someone who has hepatitis B.  You have had sex with someone who has hepatitis B.  You get hemodialysis treatment.  You take certain medicines for conditions, including cancer, organ transplantation, and autoimmune conditions. Hepatitis C  Blood testing is recommended for:  Everyone born from [birth year] through 1966.  Anyone with known risk factors for hepatitis C. Sexually transmitted infections (STIs)  You should be screened for sexually transmitted infections (STIs) including gonorrhea and chlamydia if:  You are sexually active and are younger than 46 years of age.  You are older than 46 years of age and your health care provider tells you that you are at risk for this type of infection.  Your sexual activity has changed since you were last screened and you are at an increased risk for chlamydia or gonorrhea. Ask your health care provider if you are at risk.  If you do not have HIV, but are at risk, it may be recommended that you take a prescription medicine daily to prevent HIV infection. This is called pre-exposure prophylaxis (PrEP). You are considered at  risk if:  You are sexually active and do not regularly use condoms or know the HIV status of your partner(s).  You take drugs by injection.  You are sexually active with a partner who has HIV. Talk with your health care provider about whether you are at high risk of being infected with HIV. If you choose to begin PrEP, you should first be tested for HIV. You should then be tested every 3 months for as long as you are taking PrEP. Pregnancy  If  you are premenopausal and you may become pregnant, ask your health care provider about preconception counseling.  If you may become pregnant, take 400 to 800 micrograms (mcg) of folic acid every day.  If you want to prevent pregnancy, talk to your health care provider about birth control (contraception). Osteoporosis and menopause  Osteoporosis is a disease in which the bones lose minerals and strength with aging. This can result in serious bone fractures. Your risk for osteoporosis can be identified using a bone density scan.  If you are 77 years of age or older, or if you are at risk for osteoporosis and fractures, ask your health care provider if you should be screened.  Ask your health care provider whether you should take a calcium or vitamin D supplement to lower your risk for osteoporosis.  Menopause may have certain physical symptoms and risks.  Hormone replacement therapy may reduce some of these symptoms and risks. Talk to your health care provider about whether hormone replacement therapy is right for you. Follow these instructions at home:  Schedule regular health, dental, and eye exams.  Stay current with your immunizations.  Do not use any tobacco products including cigarettes, chewing tobacco, or electronic cigarettes.  If you are pregnant, do not drink alcohol.  If you are breastfeeding, limit how much and how often you drink alcohol.  Limit alcohol intake to no more than 1 drink per day for nonpregnant women. One drink  equals 12 ounces of beer, 5 ounces of wine, or 1 ounces of hard liquor.  Do not use street drugs.  Do not share needles.  Ask your health care provider for help if you need support or information about quitting drugs.  Tell your health care provider if you often feel depressed.  Tell your health care provider if you have ever been abused or do not feel safe at home. This information is not intended to replace advice given to you by your health care provider. Make sure you discuss any questions you have with your health care provider. Document Released: 10/17/2010 Document Revised: 09/09/2015 Document Reviewed: 01/05/2015  2017 [employer]vier

## 2016-04-06 NOTE — Assessment & Plan Note (Signed)
Check TSH and vit D 

## 2016-04-06 NOTE — Assessment & Plan Note (Signed)
USPSTF grade A and B recommendations reviewed with patient; age-appropriate recommendations, preventive care, screening tests, etc discussed and encouraged; healthy living encouraged; see AVS for patient education given to patient  

## 2016-04-07 ENCOUNTER — Other Ambulatory Visit: Payer: Self-pay | Admitting: Family Medicine

## 2016-04-07 DIAGNOSIS — E538 Deficiency of other specified B group vitamins: Secondary | ICD-10-CM | POA: Insufficient documentation

## 2016-04-07 DIAGNOSIS — E559 Vitamin D deficiency, unspecified: Secondary | ICD-10-CM | POA: Insufficient documentation

## 2016-04-07 LAB — VITAMIN D 25 HYDROXY (VIT D DEFICIENCY, FRACTURES): Vit D, 25-Hydroxy: 14 ng/mL — ABNORMAL LOW (ref 30–100)

## 2016-04-07 LAB — HIV ANTIBODY (ROUTINE TESTING W REFLEX): HIV: NONREACTIVE

## 2016-04-07 MED ORDER — VITAMIN D (ERGOCALCIFEROL) 1.25 MG (50000 UNIT) PO CAPS: 50000.0000 [IU] | ORAL_CAPSULE | ORAL | 1 refills | Status: DC

## 2016-04-07 NOTE — Assessment & Plan Note (Signed)
Start OTC 

## 2016-04-07 NOTE — Assessment & Plan Note (Signed)
Start Rx 

## 2016-04-07 NOTE — Progress Notes (Signed)
rx for vit D sent 

## 2016-04-11 LAB — QUANTIFERON TB GOLD ASSAY (BLOOD)
INTERFERON GAMMA RELEASE ASSAY: NEGATIVE
Mitogen-Nil: 7.89 IU/mL
QUANTIFERON TB AG MINUS NIL: 0 [IU]/mL
Quantiferon Nil Value: 0.01 IU/mL

## 2016-04-19 DIAGNOSIS — J019 Acute sinusitis, unspecified: Secondary | ICD-10-CM | POA: Diagnosis not present

## 2016-04-27 DIAGNOSIS — J301 Allergic rhinitis due to pollen: Secondary | ICD-10-CM | POA: Diagnosis not present

## 2016-04-27 DIAGNOSIS — J3089 Other allergic rhinitis: Secondary | ICD-10-CM | POA: Diagnosis not present

## 2016-05-02 DIAGNOSIS — J301 Allergic rhinitis due to pollen: Secondary | ICD-10-CM | POA: Diagnosis not present

## 2016-05-02 DIAGNOSIS — J3089 Other allergic rhinitis: Secondary | ICD-10-CM | POA: Diagnosis not present

## 2016-05-11 DIAGNOSIS — J301 Allergic rhinitis due to pollen: Secondary | ICD-10-CM | POA: Diagnosis not present

## 2016-05-11 DIAGNOSIS — J3089 Other allergic rhinitis: Secondary | ICD-10-CM | POA: Diagnosis not present

## 2016-05-11 DIAGNOSIS — J3081 Allergic rhinitis due to animal (cat) (dog) hair and dander: Secondary | ICD-10-CM | POA: Diagnosis not present

## 2016-05-18 DIAGNOSIS — J3089 Other allergic rhinitis: Secondary | ICD-10-CM | POA: Diagnosis not present

## 2016-05-18 DIAGNOSIS — J301 Allergic rhinitis due to pollen: Secondary | ICD-10-CM | POA: Diagnosis not present

## 2016-05-30 ENCOUNTER — Other Ambulatory Visit: Payer: Self-pay | Admitting: Family Medicine

## 2016-05-30 NOTE — Telephone Encounter (Signed)
She's requesting refill, so will use 50k once a month and have her not take any OTC vit D; notes on sig and to pharmacist

## 2016-06-01 DIAGNOSIS — J3089 Other allergic rhinitis: Secondary | ICD-10-CM | POA: Diagnosis not present

## 2016-06-01 DIAGNOSIS — J301 Allergic rhinitis due to pollen: Secondary | ICD-10-CM | POA: Diagnosis not present

## 2016-06-01 DIAGNOSIS — J3081 Allergic rhinitis due to animal (cat) (dog) hair and dander: Secondary | ICD-10-CM | POA: Diagnosis not present

## 2016-06-15 DIAGNOSIS — J3089 Other allergic rhinitis: Secondary | ICD-10-CM | POA: Diagnosis not present

## 2016-06-15 DIAGNOSIS — J301 Allergic rhinitis due to pollen: Secondary | ICD-10-CM | POA: Diagnosis not present

## 2016-06-20 DIAGNOSIS — J3089 Other allergic rhinitis: Secondary | ICD-10-CM | POA: Diagnosis not present

## 2016-06-29 DIAGNOSIS — J301 Allergic rhinitis due to pollen: Secondary | ICD-10-CM | POA: Diagnosis not present

## 2016-06-29 DIAGNOSIS — J3089 Other allergic rhinitis: Secondary | ICD-10-CM | POA: Diagnosis not present

## 2016-07-06 DIAGNOSIS — J301 Allergic rhinitis due to pollen: Secondary | ICD-10-CM | POA: Diagnosis not present

## 2016-07-06 DIAGNOSIS — J3089 Other allergic rhinitis: Secondary | ICD-10-CM | POA: Diagnosis not present

## 2016-07-13 DIAGNOSIS — J3089 Other allergic rhinitis: Secondary | ICD-10-CM | POA: Diagnosis not present

## 2016-07-13 DIAGNOSIS — J301 Allergic rhinitis due to pollen: Secondary | ICD-10-CM | POA: Diagnosis not present

## 2016-07-13 DIAGNOSIS — J3081 Allergic rhinitis due to animal (cat) (dog) hair and dander: Secondary | ICD-10-CM | POA: Diagnosis not present

## 2016-07-27 DIAGNOSIS — J301 Allergic rhinitis due to pollen: Secondary | ICD-10-CM | POA: Diagnosis not present

## 2016-07-27 DIAGNOSIS — J3089 Other allergic rhinitis: Secondary | ICD-10-CM | POA: Diagnosis not present

## 2016-08-03 DIAGNOSIS — J301 Allergic rhinitis due to pollen: Secondary | ICD-10-CM | POA: Diagnosis not present

## 2016-08-03 DIAGNOSIS — J3089 Other allergic rhinitis: Secondary | ICD-10-CM | POA: Diagnosis not present

## 2016-08-17 DIAGNOSIS — J301 Allergic rhinitis due to pollen: Secondary | ICD-10-CM | POA: Diagnosis not present

## 2016-08-17 DIAGNOSIS — J3081 Allergic rhinitis due to animal (cat) (dog) hair and dander: Secondary | ICD-10-CM | POA: Diagnosis not present

## 2016-08-17 DIAGNOSIS — J3089 Other allergic rhinitis: Secondary | ICD-10-CM | POA: Diagnosis not present

## 2016-08-24 DIAGNOSIS — J3089 Other allergic rhinitis: Secondary | ICD-10-CM | POA: Diagnosis not present

## 2016-08-24 DIAGNOSIS — J301 Allergic rhinitis due to pollen: Secondary | ICD-10-CM | POA: Diagnosis not present

## 2016-08-31 DIAGNOSIS — J301 Allergic rhinitis due to pollen: Secondary | ICD-10-CM | POA: Diagnosis not present

## 2016-08-31 DIAGNOSIS — J3089 Other allergic rhinitis: Secondary | ICD-10-CM | POA: Diagnosis not present

## 2016-08-31 DIAGNOSIS — J3081 Allergic rhinitis due to animal (cat) (dog) hair and dander: Secondary | ICD-10-CM | POA: Diagnosis not present

## 2016-09-14 DIAGNOSIS — J301 Allergic rhinitis due to pollen: Secondary | ICD-10-CM | POA: Diagnosis not present

## 2016-09-14 DIAGNOSIS — J3089 Other allergic rhinitis: Secondary | ICD-10-CM | POA: Diagnosis not present

## 2016-09-29 DIAGNOSIS — J301 Allergic rhinitis due to pollen: Secondary | ICD-10-CM | POA: Diagnosis not present

## 2016-09-29 DIAGNOSIS — J3089 Other allergic rhinitis: Secondary | ICD-10-CM | POA: Diagnosis not present

## 2016-10-02 DIAGNOSIS — Z86018 Personal history of other benign neoplasm: Secondary | ICD-10-CM | POA: Diagnosis not present

## 2016-10-02 DIAGNOSIS — L853 Xerosis cutis: Secondary | ICD-10-CM | POA: Diagnosis not present

## 2016-10-02 DIAGNOSIS — D485 Neoplasm of uncertain behavior of skin: Secondary | ICD-10-CM | POA: Diagnosis not present

## 2016-10-13 DIAGNOSIS — J301 Allergic rhinitis due to pollen: Secondary | ICD-10-CM | POA: Diagnosis not present

## 2016-10-13 DIAGNOSIS — J3089 Other allergic rhinitis: Secondary | ICD-10-CM | POA: Diagnosis not present

## 2016-10-31 ENCOUNTER — Other Ambulatory Visit: Payer: Self-pay | Admitting: Family Medicine

## 2016-10-31 NOTE — Telephone Encounter (Signed)
Left a detail messaged  

## 2016-10-31 NOTE — Telephone Encounter (Signed)
Patient was only supposed to take vit D Rx once a week for 8 weeks, then 1,000 iu vitamin D3 once a day (OTC); thank you

## 2016-11-06 ENCOUNTER — Other Ambulatory Visit: Payer: Self-pay | Admitting: Obstetrics & Gynecology

## 2016-11-06 DIAGNOSIS — R921 Mammographic calcification found on diagnostic imaging of breast: Secondary | ICD-10-CM

## 2016-11-09 DIAGNOSIS — J3089 Other allergic rhinitis: Secondary | ICD-10-CM | POA: Diagnosis not present

## 2016-11-09 DIAGNOSIS — J301 Allergic rhinitis due to pollen: Secondary | ICD-10-CM | POA: Diagnosis not present

## 2016-11-15 ENCOUNTER — Ambulatory Visit
Admission: RE | Admit: 2016-11-15 | Discharge: 2016-11-15 | Disposition: A | Discharge status: Home / Self Care | Payer: BLUE CROSS/BLUE SHIELD | Source: Ambulatory Visit | Attending: Obstetrics & Gynecology | Admitting: Obstetrics & Gynecology

## 2016-11-15 DIAGNOSIS — R921 Mammographic calcification found on diagnostic imaging of breast: Secondary | ICD-10-CM | POA: Diagnosis not present

## 2016-11-27 DIAGNOSIS — D229 Melanocytic nevi, unspecified: Secondary | ICD-10-CM | POA: Diagnosis not present

## 2016-11-27 DIAGNOSIS — D485 Neoplasm of uncertain behavior of skin: Secondary | ICD-10-CM | POA: Diagnosis not present

## 2016-12-08 DIAGNOSIS — J301 Allergic rhinitis due to pollen: Secondary | ICD-10-CM | POA: Diagnosis not present

## 2016-12-08 DIAGNOSIS — J3089 Other allergic rhinitis: Secondary | ICD-10-CM | POA: Diagnosis not present

## 2016-12-22 DIAGNOSIS — J3081 Allergic rhinitis due to animal (cat) (dog) hair and dander: Secondary | ICD-10-CM | POA: Diagnosis not present

## 2016-12-22 DIAGNOSIS — J3089 Other allergic rhinitis: Secondary | ICD-10-CM | POA: Diagnosis not present

## 2016-12-22 DIAGNOSIS — J301 Allergic rhinitis due to pollen: Secondary | ICD-10-CM | POA: Diagnosis not present

## 2016-12-26 DIAGNOSIS — J301 Allergic rhinitis due to pollen: Secondary | ICD-10-CM | POA: Diagnosis not present

## 2016-12-26 DIAGNOSIS — J3089 Other allergic rhinitis: Secondary | ICD-10-CM | POA: Diagnosis not present

## 2017-01-02 DIAGNOSIS — J3089 Other allergic rhinitis: Secondary | ICD-10-CM | POA: Diagnosis not present

## 2017-01-02 DIAGNOSIS — J301 Allergic rhinitis due to pollen: Secondary | ICD-10-CM | POA: Diagnosis not present

## 2017-01-12 DIAGNOSIS — J301 Allergic rhinitis due to pollen: Secondary | ICD-10-CM | POA: Diagnosis not present

## 2017-01-12 DIAGNOSIS — J3089 Other allergic rhinitis: Secondary | ICD-10-CM | POA: Diagnosis not present

## 2017-01-19 DIAGNOSIS — J301 Allergic rhinitis due to pollen: Secondary | ICD-10-CM | POA: Diagnosis not present

## 2017-01-19 DIAGNOSIS — J3089 Other allergic rhinitis: Secondary | ICD-10-CM | POA: Diagnosis not present

## 2017-01-30 DIAGNOSIS — J301 Allergic rhinitis due to pollen: Secondary | ICD-10-CM | POA: Diagnosis not present

## 2017-01-30 DIAGNOSIS — J3089 Other allergic rhinitis: Secondary | ICD-10-CM | POA: Diagnosis not present

## 2017-02-06 DIAGNOSIS — J3089 Other allergic rhinitis: Secondary | ICD-10-CM | POA: Diagnosis not present

## 2017-02-06 DIAGNOSIS — J301 Allergic rhinitis due to pollen: Secondary | ICD-10-CM | POA: Diagnosis not present

## 2017-02-27 DIAGNOSIS — J3081 Allergic rhinitis due to animal (cat) (dog) hair and dander: Secondary | ICD-10-CM | POA: Diagnosis not present

## 2017-02-27 DIAGNOSIS — J3089 Other allergic rhinitis: Secondary | ICD-10-CM | POA: Diagnosis not present

## 2017-02-27 DIAGNOSIS — J301 Allergic rhinitis due to pollen: Secondary | ICD-10-CM | POA: Diagnosis not present

## 2017-03-20 DIAGNOSIS — J3081 Allergic rhinitis due to animal (cat) (dog) hair and dander: Secondary | ICD-10-CM | POA: Diagnosis not present

## 2017-03-20 DIAGNOSIS — J3089 Other allergic rhinitis: Secondary | ICD-10-CM | POA: Diagnosis not present

## 2017-03-20 DIAGNOSIS — J301 Allergic rhinitis due to pollen: Secondary | ICD-10-CM | POA: Diagnosis not present

## 2017-03-27 DIAGNOSIS — J3081 Allergic rhinitis due to animal (cat) (dog) hair and dander: Secondary | ICD-10-CM | POA: Diagnosis not present

## 2017-03-27 DIAGNOSIS — L309 Dermatitis, unspecified: Secondary | ICD-10-CM | POA: Diagnosis not present

## 2017-03-27 DIAGNOSIS — J3089 Other allergic rhinitis: Secondary | ICD-10-CM | POA: Diagnosis not present

## 2017-03-27 DIAGNOSIS — J301 Allergic rhinitis due to pollen: Secondary | ICD-10-CM | POA: Diagnosis not present

## 2017-03-31 ENCOUNTER — Encounter: Payer: Self-pay | Admitting: Family Medicine

## 2017-04-02 DIAGNOSIS — J3081 Allergic rhinitis due to animal (cat) (dog) hair and dander: Secondary | ICD-10-CM | POA: Diagnosis not present

## 2017-04-02 DIAGNOSIS — J301 Allergic rhinitis due to pollen: Secondary | ICD-10-CM | POA: Diagnosis not present

## 2017-04-03 DIAGNOSIS — J301 Allergic rhinitis due to pollen: Secondary | ICD-10-CM | POA: Diagnosis not present

## 2017-04-03 DIAGNOSIS — J3081 Allergic rhinitis due to animal (cat) (dog) hair and dander: Secondary | ICD-10-CM | POA: Diagnosis not present

## 2017-04-03 DIAGNOSIS — J3089 Other allergic rhinitis: Secondary | ICD-10-CM | POA: Diagnosis not present

## 2017-04-11 ENCOUNTER — Encounter: Payer: BLUE CROSS/BLUE SHIELD | Admitting: Family Medicine

## 2017-04-12 DIAGNOSIS — J301 Allergic rhinitis due to pollen: Secondary | ICD-10-CM | POA: Diagnosis not present

## 2017-04-12 DIAGNOSIS — J3081 Allergic rhinitis due to animal (cat) (dog) hair and dander: Secondary | ICD-10-CM | POA: Diagnosis not present

## 2017-04-12 DIAGNOSIS — J3089 Other allergic rhinitis: Secondary | ICD-10-CM | POA: Diagnosis not present

## 2017-04-27 ENCOUNTER — Ambulatory Visit (INDEPENDENT_AMBULATORY_CARE_PROVIDER_SITE_OTHER): Payer: BLUE CROSS/BLUE SHIELD | Admitting: Family Medicine

## 2017-04-27 ENCOUNTER — Encounter: Payer: Self-pay | Admitting: Family Medicine

## 2017-04-27 VITALS — BP 112/72 | HR 96 | Temp 98.1°F | Resp 14 | Wt 148.8 lb

## 2017-04-27 DIAGNOSIS — Z823 Family history of stroke: Secondary | ICD-10-CM

## 2017-04-27 DIAGNOSIS — Z0000 Encounter for general adult medical examination without abnormal findings: Secondary | ICD-10-CM | POA: Diagnosis not present

## 2017-04-27 DIAGNOSIS — Z0184 Encounter for antibody response examination: Secondary | ICD-10-CM

## 2017-04-27 DIAGNOSIS — J3489 Other specified disorders of nose and nasal sinuses: Secondary | ICD-10-CM | POA: Diagnosis not present

## 2017-04-27 DIAGNOSIS — Z111 Encounter for screening for respiratory tuberculosis: Secondary | ICD-10-CM | POA: Diagnosis not present

## 2017-04-27 DIAGNOSIS — D239 Other benign neoplasm of skin, unspecified: Secondary | ICD-10-CM

## 2017-04-27 DIAGNOSIS — Z23 Encounter for immunization: Secondary | ICD-10-CM

## 2017-04-27 DIAGNOSIS — Z0001 Encounter for general adult medical examination with abnormal findings: Secondary | ICD-10-CM

## 2017-04-27 DIAGNOSIS — E538 Deficiency of other specified B group vitamins: Secondary | ICD-10-CM

## 2017-04-27 DIAGNOSIS — E559 Vitamin D deficiency, unspecified: Secondary | ICD-10-CM | POA: Diagnosis not present

## 2017-04-27 HISTORY — DX: Other benign neoplasm of skin, unspecified: D23.9

## 2017-04-27 MED ORDER — MUPIROCIN CALCIUM 2 % EX CREA: 1.0000 "application " | TOPICAL_CREAM | Freq: Two times a day (BID) | CUTANEOUS | 0 refills | Status: DC

## 2017-04-27 NOTE — Assessment & Plan Note (Signed)
USPSTF grade A and B recommendations reviewed with patient; age-appropriate recommendations, preventive care, screening tests, etc discussed and encouraged; healthy living encouraged; see AVS for patient education given to patient  

## 2017-04-27 NOTE — Assessment & Plan Note (Signed)
Recheck and supplement if needed

## 2017-04-27 NOTE — Progress Notes (Signed)
Patient ID: [name], female   DOB: 16-Nov-1970, 47 y.o.   MRN: [id number]   Subjective:   Ambriella Kitt Weber is a 47 y.o. female here for a complete physical exam  Interim issues since last visit: no medical excitement  USPSTF grade A and B recommendations Depression:  Depression screen [facility] 2/9 04/27/2017 04/06/2016 11/23/2014  Decreased Interest 0 0 0  Down, Depressed, Hopeless 0 0 0  PHQ - 2 Score 0 0 0   Hypertension: BP Readings from Last 3 Encounters:  04/27/17 112/72  04/06/16 110/72  11/23/14 111/72   Obesity: Wt Readings from Last 3 Encounters:  04/27/17 148 lb 12.8 oz (67.5 kg)  04/06/16 152 lb (68.9 kg)  11/23/14 145 lb 12.8 oz (66.1 kg)   BMI Readings from Last 3 Encounters:  04/27/17 24.02 kg/m  04/06/16 24.53 kg/m  11/23/14 23.53 kg/m     Skin cancer: no worrisome moles; goes to see derm yearly; hx of dysplastic nevus Lung cancer:  nonsmoker Breast cancer: through GYN; getting yearly mammograms; have had biopsy, benign Colorectal cancer: no family hx; start at 84, discussed controversy  BRCA gene screening: family hx of breast and/or ovarian cancer and/or metastatic prostate cancer? MGM had breast cancer as an old women; PGM had breast cancer in her 66s or 84s, metastasized to bones; PGM's brother had breast cancer; has not talked to anyone about BRCA; father does not have cancer to her knowledge, nursing home Cervical cancer screening: through GYN HIV, hep B, hep C: check HIV, would like to be tested for Hep B immunity STD testing and prevention (chl/gon/syphilis): not interested Intimate partner violence: no abuse Contraception: nothing Osteoporosis: no hx of steroids Fall prevention/vitamin D: discussed Vit D and B12 deficiency; taking few times a week Diet: fruits and veggies Exercise: needs to work on that she says; discussed 6,000 steps a day Alcohol: less than 7 Tobacco use: nonsmoker Aspirin:n/a Lipids: nonfasting Lab Results  Component  Value Date   CHOL 150 04/06/2016   CHOL 142 11/25/2014   Lab Results  Component Value Date   HDL 51 04/06/2016   HDL 55 11/25/2014   Lab Results  Component Value Date   LDLCALC 74 04/06/2016   [place] 73 11/25/2014   Lab Results  Component Value Date   TRIG 123 04/06/2016   TRIG 71 11/25/2014   Lab Results  Component Value Date   CHOLHDL 2.9 04/06/2016   CHOLHDL 2.6 11/25/2014   No results found for: LDLDIRECT Glucose:  Glucose  Date Value Ref Range Status  11/25/2014 85 65 - 99 mg/dL Final   Glucose, Bld  Date Value Ref Range Status  04/06/2016 80 65 - 99 mg/dL Final   AAA: n/a Concerned about stroke; in the back of her mind about NSAIDs; father has had multiple strokes, as well as his father and cousin  In her left nostril, she has a crusty place, constant    Past Medical History:  Diagnosis Date  . Allergy   . Asthma   . Dysplastic nevus 04/27/2017   Right leg, left shoulder, left thigh, sees dermatologist, Dr. Ree Edman  . Heart murmur    Past Surgical History:  Procedure Laterality Date  . BREAST BIOPSY Right 03/06/2016   benign  . BREAST SURGERY Bilateral 2000   Breast Reduction  . CHOLECYSTECTOMY  1999  . LIPOSUCTION  2012   Fat deposit in back  . REDUCTION MAMMAPLASTY Bilateral    1999   Family History  Problem Relation  Age of Onset  . Cancer Mother        lung  . Diabetes Mother   . Stroke Father   . Hyperlipidemia Father   . Down syndrome Son   . Breast cancer Maternal Grandmother   . Breast cancer Paternal Grandmother    Social History   Tobacco Use  . Smoking status: Never Smoker  . Smokeless tobacco: Never Used  Substance Use Topics  . Alcohol use: Yes    Alcohol/week: 1.8 - 2.4 oz    Types: 3 - 4 Standard drinks or equivalent per week  . Drug use: No   Review of Systems  Objective:   Vitals:   04/27/17 1116  BP: 112/72  Pulse: 96  Resp: 14  Temp: 98.1 F (36.7 C)  TempSrc: Oral  SpO2: 97%  Weight:  148 lb 12.8 oz (67.5 kg)   Body mass index is 24.02 kg/m. Wt Readings from Last 3 Encounters:  04/27/17 148 lb 12.8 oz (67.5 kg)  04/06/16 152 lb (68.9 kg)  11/23/14 145 lb 12.8 oz (66.1 kg)   Physical Exam  Constitutional: She appears well-developed and well-nourished.  HENT:  Head: Normocephalic and atraumatic.  Right Ear: Hearing, tympanic membrane, external ear and ear canal normal.  Left Ear: Hearing, tympanic membrane, external ear and ear canal normal.  Mouth/Throat: Oropharynx is clear and moist and mucous membranes are normal.  Sore in the vestibule superior aspect LEFT nostril; no bleeding  Eyes: Conjunctivae and EOM are normal. Right eye exhibits no hordeolum. Left eye exhibits no hordeolum. No scleral icterus.  Neck: Carotid bruit is not present. No thyromegaly present.  Cardiovascular: Normal rate, regular rhythm, S1 normal, S2 normal and normal heart sounds.  No extrasystoles are present.  Pulmonary/Chest: Effort normal and breath sounds normal. No respiratory distress.  Abdominal: Soft. Normal appearance and bowel sounds are normal. She exhibits no distension, no abdominal bruit, no pulsatile midline mass and no mass. There is no hepatosplenomegaly. There is no tenderness. No hernia.  Musculoskeletal: Normal range of motion. She exhibits no edema.       Feet:  Small (less than a pea-sized) knot on the proximal aspect of the LEFT first MTP  Lymphadenopathy:       Head (right side): No submandibular adenopathy present.       Head (left side): No submandibular adenopathy present.    She has no cervical adenopathy.  Neurological: She is alert. She displays no tremor. No cranial nerve deficit. She exhibits normal muscle tone. Gait normal.  Reflex Scores:      Patellar reflexes are 2+ on the right side and 2+ on the left side. Skin: Skin is warm and dry. No bruising and no ecchymosis noted. No cyanosis. No pallor.  Psychiatric: Her speech is normal and behavior is normal.  Thought content normal. Her mood appears not anxious. She does not exhibit a depressed mood.   Assessment/Plan:   Problem List Items Addressed This Visit      Other   Screening for tuberculosis   Relevant Orders   QuantiFERON-TB Gold Plus   Vitamin D deficiency    Check vit D level, 800 iu a few times a day unless instructed otherwise      Relevant Orders   VITAMIN D 25 Hydroxy (Vit-D Deficiency, Fractures)   Vitamin B12 deficiency    Recheck and supplement if needed      Relevant Orders   B12   Preventative health care - Primary    USPSTF  grade A and B recommendations reviewed with patient; age-appropriate recommendations, preventive care, screening tests, etc discussed and encouraged; healthy living encouraged; see AVS for patient education given to patient       Relevant Orders   CBC with Differential/Platelet   COMPLETE METABOLIC PANEL WITH GFR   Lipid panel   TSH   Dysplastic nevus    Sees dermatologist       Other Visit Diagnoses    Needs flu shot       Relevant Orders   Flu Vaccine QUAD 6+ mos PF IM (Fluarix Quad PF) (Completed)   Need for diphtheria-tetanus-pertussis (Tdap) vaccine       Relevant Orders   Tdap vaccine greater than or equal to 7yo IM (Completed)   Immunity status testing       Relevant Orders   Hepatitis B Surface AntiBODY   Hepatitis B Surface AntiGEN   Family history of stroke       discussed with patient; she has had some testing, not sure about MTHFR testing though; if she wants referral to neuro, let me know; see AVS   Sore in nostril       encouraged patient to see her ENT; will try mupirocin first      Meds ordered this encounter  Medications  . mupirocin cream (BACTROBAN) 2 %    Sig: Apply 1 application topically 2 (two) times daily.    Dispense:  15 g    Refill:  0    Either cream or ointment is okay   Orders Placed This Encounter  Procedures  . Flu Vaccine QUAD 6+ mos PF IM (Fluarix Quad PF)  . Tdap vaccine greater  than or equal to 7yo IM  . Hepatitis B Surface AntiBODY  . Hepatitis B Surface AntiGEN  . CBC with Differential/Platelet  . COMPLETE METABOLIC PANEL WITH GFR  . Lipid panel  . TSH  . VITAMIN D 25 Hydroxy (Vit-D Deficiency, Fractures)  . B12  . QuantiFERON-TB Gold Plus    Follow up plan: Return in about 1 year (around 04/27/2018) for complete physical.  An After Visit Summary was printed and given to the patient.

## 2017-04-27 NOTE — Assessment & Plan Note (Signed)
Check vit D level, 800 iu a few times a day unless instructed otherwise

## 2017-04-27 NOTE — Assessment & Plan Note (Signed)
Sees dermatologist

## 2017-04-27 NOTE — Patient Instructions (Addendum)
Talk to your gynecologist about your family history of breast cancer, especially in that great uncle I'll recommend 800 iu of vitamin D  You received the flu shot today; it should protect you against the flu virus over the coming months; it will take about two weeks for antibodies to develop; do try to stay away from hospitals, nursing homes, and daycares during peak flu season; taking extra vitamin C daily during flu season may help you avoid getting sick  You received the vaccine to protect against tetanus and diphtheria and pertussis today; the tetanus and diphtheria portions will provide protection up to ten years, and the pertussis component will give you protection against whooping cough for life   Stroke Prevention Some medical conditions and behaviors are associated with a higher chance of having a stroke. You can help prevent a stroke by making nutrition, lifestyle, and other changes, including managing any medical conditions you may have. What nutrition changes can be made?  Eat healthy foods. You can do this by: ? Choosing foods high in fiber, such as fresh fruits and vegetables and whole grains. ? Eating at least 5 or more servings of fruits and vegetables a day. Try to fill half of your plate at each meal with fruits and vegetables. ? Choosing lean protein foods, such as lean cuts of meat, poultry without skin, fish, tofu, beans, and nuts. ? Eating low-fat dairy products. ? Avoiding foods that are high in salt (sodium). This can help lower blood pressure. ? Avoiding foods that have saturated fat, trans fat, and cholesterol. This can help prevent high cholesterol. ? Avoiding processed and premade foods.  Follow your health care provider's specific guidelines for losing weight, controlling high blood pressure (hypertension), lowering high cholesterol, and managing diabetes. These may include: ? Reducing your daily calorie intake. ? Limiting your daily sodium intake to 1,500  milligrams (mg). ? Using only healthy fats for cooking, such as olive oil, canola oil, or sunflower oil. ? Counting your daily carbohydrate intake. What lifestyle changes can be made?  Maintain a healthy weight. Talk to your health care provider about your ideal weight.  Get at least 30 minutes of moderate physical activity at least 5 days a week. Moderate activity includes brisk walking, biking, and swimming.  Do not use any products that contain nicotine or tobacco, such as cigarettes and e-cigarettes. If you need help quitting, ask your health care provider. It may also be helpful to avoid exposure to secondhand smoke.  Limit alcohol intake to no more than 1 drink a day for nonpregnant women and 2 drinks a day for men. One drink equals 12 oz of beer, 5 oz of wine, or 1 oz of hard liquor.  Stop any illegal drug use.  Avoid taking birth control pills. Talk to your health care provider about the risks of taking birth control pills if: ? You are over 39 years old. ? You smoke. ? You get migraines. ? You have ever had a blood clot. What other changes can be made?  Manage your cholesterol levels. ? Eating a healthy diet is important for preventing high cholesterol. If cholesterol cannot be managed through diet alone, you may also need to take medicines. ? Take any prescribed medicines to control your cholesterol as told by your health care provider.  Manage your diabetes. ? Eating a healthy diet and exercising regularly are important parts of managing your blood sugar. If your blood sugar cannot be managed through diet and exercise, you may  need to take medicines. ? Take any prescribed medicines to control your diabetes as told by your health care provider.  Control your hypertension. ? To reduce your risk of stroke, try to keep your blood pressure below 130/80. ? Eating a healthy diet and exercising regularly are an important part of controlling your blood pressure. If your blood  pressure cannot be managed through diet and exercise, you may need to take medicines. ? Take any prescribed medicines to control hypertension as told by your health care provider. ? Ask your health care provider if you should monitor your blood pressure at home. ? Have your blood pressure checked every year, even if your blood pressure is normal. Blood pressure increases with age and some medical conditions.  Get evaluated for sleep disorders (sleep apnea). Talk to your health care provider about getting a sleep evaluation if you snore a lot or have excessive sleepiness.  Take over-the-counter and prescription medicines only as told by your health care provider. Aspirin or blood thinners (antiplatelets or anticoagulants) may be recommended to reduce your risk of forming blood clots that can lead to stroke.  Make sure that any other medical conditions you have, such as atrial fibrillation or atherosclerosis, are managed. What are the warning signs of a stroke? The warning signs of a stroke can be easily remembered as BEFAST.  B is for balance. Signs include: ? Dizziness. ? Loss of balance or coordination. ? Sudden trouble walking.  E is for eyes. Signs include: ? A sudden change in vision. ? Trouble seeing.  F is for face. Signs include: ? Sudden weakness or numbness of the face. ? The face or eyelid drooping to one side.  A is for arms. Signs include: ? Sudden weakness or numbness of the arm, usually on one side of the body.  S is for speech. Signs include: ? Trouble speaking (aphasia). ? Trouble understanding.  T is for time. ? These symptoms may represent a serious problem that is an emergency. Do not wait to see if the symptoms will go away. Get medical help right away. Call your local emergency services (911 in the [place] Do not drive yourself to the hospital.  Other signs of stroke may include: ? A sudden, severe headache with no known cause. ? Nausea or  vomiting. ? Seizure.  Where to find more information: For more information, visit:  American Stroke Association: www.strokeassociation.org  National Stroke Association: www.stroke.org  Summary  You can prevent a stroke by eating healthy, exercising, not smoking, limiting alcohol intake, and managing any medical conditions you may have.  Do not use any products that contain nicotine or tobacco, such as cigarettes and e-cigarettes. If you need help quitting, ask your health care provider. It may also be helpful to avoid exposure to secondhand smoke.  Remember BEFAST for warning signs of stroke. Get help right away if you or a loved one has any of these signs. This information is not intended to replace advice given to you by your health care provider. Make sure you discuss any questions you have with your health care provider. Document Released: 05/11/2004 Document Revised: 05/09/2016 Document Reviewed: 05/09/2016 Elsevier Interactive Patient Education  2018 [place] Maintenance, Female Adopting a healthy lifestyle and getting preventive care can go a long way to promote health and wellness. Talk with your health care provider about what schedule of regular examinations is right for you. This is a good chance for you to check in with your provider about  disease prevention and staying healthy. In between checkups, there are plenty of things you can do on your own. Experts have done a lot of research about which lifestyle changes and preventive measures are most likely to keep you healthy. Ask your health care provider for more information. Weight and diet Eat a healthy diet  Be sure to include plenty of vegetables, fruits, low-fat dairy products, and lean protein.  Do not eat a lot of foods high in solid fats, added sugars, or salt.  Get regular exercise. This is one of the most important things you can do for your health. ? Most adults should exercise for at least 150  minutes each week. The exercise should increase your heart rate and make you sweat (moderate-intensity exercise). ? Most adults should also do strengthening exercises at least twice a week. This is in addition to the moderate-intensity exercise.  Maintain a healthy weight  Body mass index (BMI) is a measurement that can be used to identify possible weight problems. It estimates body fat based on height and weight. Your health care provider can help determine your BMI and help you achieve or maintain a healthy weight.  For females 36 years of age and older: ? A BMI below 18.5 is considered underweight. ? A BMI of 18.5 to 24.9 is normal. ? A BMI of 25 to 29.9 is considered overweight. ? A BMI of 30 and above is considered obese.  Watch levels of cholesterol and blood lipids  You should start having your blood tested for lipids and cholesterol at 48 years of age, then have this test every 5 years.  You may need to have your cholesterol levels checked more often if: ? Your lipid or cholesterol levels are high. ? You are older than 48 years of age. ? You are at high risk for heart disease.  Cancer screening Lung Cancer  Lung cancer screening is recommended for adults 33-80 years old who are at high risk for lung cancer because of a history of smoking.  A yearly low-dose CT scan of the lungs is recommended for people who: ? Currently smoke. ? Have quit within the past 15 years. ? Have at least a 30-pack-year history of smoking. A pack year is smoking an average of one pack of cigarettes a day for 1 year.  Yearly screening should continue until it has been 15 years since you quit.  Yearly screening should stop if you develop a health problem that would prevent you from having lung cancer treatment.  Breast Cancer  Practice breast self-awareness. This means understanding how your breasts normally appear and feel.  It also means doing regular breast self-exams. Let your health care  provider know about any changes, no matter how small.  If you are in your 20s or 30s, you should have a clinical breast exam (CBE) by a health care provider every 1-3 years as part of a regular health exam.  If you are 33 or older, have a CBE every year. Also consider having a breast X-ray (mammogram) every year.  If you have a family history of breast cancer, talk to your health care provider about genetic screening.  If you are at high risk for breast cancer, talk to your health care provider about having an MRI and a mammogram every year.  Breast cancer gene (BRCA) assessment is recommended for women who have family members with BRCA-related cancers. BRCA-related cancers include: ? Breast. ? Ovarian. ? Tubal. ? Peritoneal cancers.  Results of  the assessment will determine the need for genetic counseling and BRCA1 and BRCA2 testing.  Cervical Cancer Your health care provider may recommend that you be screened regularly for cancer of the pelvic organs (ovaries, uterus, and vagina). This screening involves a pelvic examination, including checking for microscopic changes to the surface of your cervix (Pap test). You may be encouraged to have this screening done every 3 years, beginning at age 67.  For women ages 69-65, health care providers may recommend pelvic exams and Pap testing every 3 years, or they may recommend the Pap and pelvic exam, combined with testing for human papilloma virus (HPV), every 5 years. Some types of HPV increase your risk of cervical cancer. Testing for HPV may also be done on women of any age with unclear Pap test results.  Other health care providers may not recommend any screening for nonpregnant women who are considered low risk for pelvic cancer and who do not have symptoms. Ask your health care provider if a screening pelvic exam is right for you.  If you have had past treatment for cervical cancer or a condition that could lead to cancer, you need Pap tests  and screening for cancer for at least 20 years after your treatment. If Pap tests have been discontinued, your risk factors (such as having a new sexual partner) need to be reassessed to determine if screening should resume. Some women have medical problems that increase the chance of getting cervical cancer. In these cases, your health care provider may recommend more frequent screening and Pap tests.  Colorectal Cancer  This type of cancer can be detected and often prevented.  Routine colorectal cancer screening usually begins at 48 years of age and continues through 48 years of age.  Your health care provider may recommend screening at an earlier age if you have risk factors for colon cancer.  Your health care provider may also recommend using home test kits to check for hidden blood in the stool.  A small camera at the end of a tube can be used to examine your colon directly (sigmoidoscopy or colonoscopy). This is done to check for the earliest forms of colorectal cancer.  Routine screening usually begins at age 69.  Direct examination of the colon should be repeated every 5-10 years through 48 years of age. However, you may need to be screened more often if early forms of precancerous polyps or small growths are found.  Skin Cancer  Check your skin from head to toe regularly.  Tell your health care provider about any new moles or changes in moles, especially if there is a change in a mole's shape or color.  Also tell your health care provider if you have a mole that is larger than the size of a pencil eraser.  Always use sunscreen. Apply sunscreen liberally and repeatedly throughout the day.  Protect yourself by wearing long sleeves, pants, a wide-brimmed hat, and sunglasses whenever you are outside.  Heart disease, diabetes, and high blood pressure  High blood pressure causes heart disease and increases the risk of stroke. High blood pressure is more likely to develop  in: ? People who have blood pressure in the high end of the normal range (130-139/85-89 mm Hg). ? People who are overweight or obese. ? People who are African American.  If you are 71-63 years of age, have your blood pressure checked every 3-5 years. If you are 29 years of age or older, have your blood pressure checked every  year. You should have your blood pressure measured twice-once when you are at a hospital or clinic, and once when you are not at a hospital or clinic. Record the average of the two measurements. To check your blood pressure when you are not at a hospital or clinic, you can use: ? An automated blood pressure machine at a pharmacy. ? A home blood pressure monitor.  If you are between 49 years and 60 years old, ask your health care provider if you should take aspirin to prevent strokes.  Have regular diabetes screenings. This involves taking a blood sample to check your fasting blood sugar level. ? If you are at a normal weight and have a low risk for diabetes, have this test once every three years after 48 years of age. ? If you are overweight and have a high risk for diabetes, consider being tested at a younger age or more often. Preventing infection Hepatitis B  If you have a higher risk for hepatitis B, you should be screened for this virus. You are considered at high risk for hepatitis B if: ? You were born in a country where hepatitis B is common. Ask your health care provider which countries are considered high risk. ? Your parents were born in a high-risk country, and you have not been immunized against hepatitis B (hepatitis B vaccine). ? You have HIV or AIDS. ? You use needles to inject street drugs. ? You live with someone who has hepatitis B. ? You have had sex with someone who has hepatitis B. ? You get hemodialysis treatment. ? You take certain medicines for conditions, including cancer, organ transplantation, and autoimmune conditions.  Hepatitis C  Blood  testing is recommended for: ? Everyone born from [birth year] through 1966. ? Anyone with known risk factors for hepatitis C.  Sexually transmitted infections (STIs)  You should be screened for sexually transmitted infections (STIs) including gonorrhea and chlamydia if: ? You are sexually active and are younger than 48 years of age. ? You are older than 48 years of age and your health care provider tells you that you are at risk for this type of infection. ? Your sexual activity has changed since you were last screened and you are at an increased risk for chlamydia or gonorrhea. Ask your health care provider if you are at risk.  If you do not have HIV, but are at risk, it may be recommended that you take a prescription medicine daily to prevent HIV infection. This is called pre-exposure prophylaxis (PrEP). You are considered at risk if: ? You are sexually active and do not regularly use condoms or know the HIV status of your partner(s). ? You take drugs by injection. ? You are sexually active with a partner who has HIV.  Talk with your health care provider about whether you are at high risk of being infected with HIV. If you choose to begin PrEP, you should first be tested for HIV. You should then be tested every 3 months for as long as you are taking PrEP. Pregnancy  If you are premenopausal and you may become pregnant, ask your health care provider about preconception counseling.  If you may become pregnant, take 400 to 800 micrograms (mcg) of folic acid every day.  If you want to prevent pregnancy, talk to your health care provider about birth control (contraception). Osteoporosis and menopause  Osteoporosis is a disease in which the bones lose minerals and strength with aging. This can result in  serious bone fractures. Your risk for osteoporosis can be identified using a bone density scan.  If you are 23 years of age or older, or if you are at risk for osteoporosis and fractures, ask your  health care provider if you should be screened.  Ask your health care provider whether you should take a calcium or vitamin D supplement to lower your risk for osteoporosis.  Menopause may have certain physical symptoms and risks.  Hormone replacement therapy may reduce some of these symptoms and risks. Talk to your health care provider about whether hormone replacement therapy is right for you. Follow these instructions at home:  Schedule regular health, dental, and eye exams.  Stay current with your immunizations.  Do not use any tobacco products including cigarettes, chewing tobacco, or electronic cigarettes.  If you are pregnant, do not drink alcohol.  If you are breastfeeding, limit how much and how often you drink alcohol.  Limit alcohol intake to no more than 1 drink per day for nonpregnant women. One drink equals 12 ounces of beer, 5 ounces of wine, or 1 ounces of hard liquor.  Do not use street drugs.  Do not share needles.  Ask your health care provider for help if you need support or information about quitting drugs.  Tell your health care provider if you often feel depressed.  Tell your health care provider if you have ever been abused or do not feel safe at home. This information is not intended to replace advice given to you by your health care provider. Make sure you discuss any questions you have with your health care provider. Document Released: 10/17/2010 Document Revised: 09/09/2015 Document Reviewed: 01/05/2015 Elsevier Interactive Patient Education  [employer].

## 2017-04-29 LAB — CBC WITH DIFFERENTIAL/PLATELET
BASOS PCT: 0.9 %
Basophils Absolute: 50 cells/uL (ref 0–200)
Eosinophils Absolute: 174 cells/uL (ref 15–500)
Eosinophils Relative: 3.1 %
HCT: 38.9 % (ref 35.0–45.0)
Hemoglobin: 13.2 g/dL (ref 11.7–15.5)
Lymphs Abs: 1562 cells/uL (ref 850–3900)
MCH: 30.3 pg (ref 27.0–33.0)
MCHC: 33.9 g/dL (ref 32.0–36.0)
MCV: 89.2 fL (ref 80.0–100.0)
MONOS PCT: 10.1 %
MPV: 10 fL (ref 7.5–12.5)
Neutro Abs: 3248 cells/uL (ref 1500–7800)
Neutrophils Relative %: 58 %
PLATELETS: 262 10*3/uL (ref 140–400)
RBC: 4.36 10*6/uL (ref 3.80–5.10)
RDW: 12.2 % (ref 11.0–15.0)
TOTAL LYMPHOCYTE: 27.9 %
WBC mixed population: 566 cells/uL (ref 200–950)
WBC: 5.6 10*3/uL (ref 3.8–10.8)

## 2017-04-29 LAB — QUANTIFERON-TB GOLD PLUS
MITOGEN-NIL: 8.5 [IU]/mL
NIL: 0.01 [IU]/mL
QuantiFERON-TB Gold Plus: NEGATIVE
TB1-NIL: 0 IU/mL
TB2-NIL: 0 IU/mL

## 2017-04-29 LAB — COMPLETE METABOLIC PANEL WITH GFR
AG Ratio: 2.2 (calc) (ref 1.0–2.5)
ALKALINE PHOSPHATASE (APISO): 48 U/L (ref 33–115)
ALT: 14 U/L (ref 6–29)
AST: 19 U/L (ref 10–35)
Albumin: 4.4 g/dL (ref 3.6–5.1)
BUN: 8 mg/dL (ref 7–25)
CO2: 30 mmol/L (ref 20–32)
Calcium: 8.9 mg/dL (ref 8.6–10.2)
Chloride: 103 mmol/L (ref 98–110)
Creat: 0.66 mg/dL (ref 0.50–1.10)
GFR, Est African American: 122 mL/min/{1.73_m2} (ref >=60)
GFR, Est Non African American: 105 mL/min/{1.73_m2} (ref >=60)
GLUCOSE: 84 mg/dL (ref 65–99)
Globulin: 2 g/dL (calc) (ref 1.9–3.7)
Potassium: 4.2 mmol/L (ref 3.5–5.3)
Sodium: 140 mmol/L (ref 135–146)
Total Bilirubin: 0.5 mg/dL (ref 0.2–1.2)
Total Protein: 6.4 g/dL (ref 6.1–8.1)

## 2017-04-29 LAB — HEPATITIS B SURFACE ANTIBODY,QUALITATIVE: HEP B S AB: NONREACTIVE

## 2017-04-29 LAB — VITAMIN D 25 HYDROXY (VIT D DEFICIENCY, FRACTURES): Vit D, 25-Hydroxy: 23 ng/mL — ABNORMAL LOW (ref 30–100)

## 2017-04-29 LAB — LIPID PANEL
CHOL/HDL RATIO: 2.8 (calc) (ref <5.0)
CHOLESTEROL: 146 mg/dL (ref <200)
HDL: 53 mg/dL (ref >50)
LDL CHOLESTEROL (CALC): 72 mg/dL
Non-HDL Cholesterol (Calc): 93 mg/dL (calc) (ref <130)
Triglycerides: 129 mg/dL (ref <150)

## 2017-04-29 LAB — HEPATITIS B SURFACE ANTIGEN: HEP B S AG: NONREACTIVE

## 2017-04-29 LAB — VITAMIN B12: Vitamin B-12: 507 pg/mL (ref 200–1100)

## 2017-04-29 LAB — TSH: TSH: 1.12 m[IU]/L

## 2017-05-17 DIAGNOSIS — J3089 Other allergic rhinitis: Secondary | ICD-10-CM | POA: Diagnosis not present

## 2017-05-17 DIAGNOSIS — J301 Allergic rhinitis due to pollen: Secondary | ICD-10-CM | POA: Diagnosis not present

## 2017-05-17 DIAGNOSIS — J3081 Allergic rhinitis due to animal (cat) (dog) hair and dander: Secondary | ICD-10-CM | POA: Diagnosis not present

## 2017-05-25 DIAGNOSIS — J3081 Allergic rhinitis due to animal (cat) (dog) hair and dander: Secondary | ICD-10-CM | POA: Diagnosis not present

## 2017-05-25 DIAGNOSIS — J3089 Other allergic rhinitis: Secondary | ICD-10-CM | POA: Diagnosis not present

## 2017-05-25 DIAGNOSIS — J301 Allergic rhinitis due to pollen: Secondary | ICD-10-CM | POA: Diagnosis not present

## 2017-05-31 DIAGNOSIS — J3081 Allergic rhinitis due to animal (cat) (dog) hair and dander: Secondary | ICD-10-CM | POA: Diagnosis not present

## 2017-05-31 DIAGNOSIS — J3089 Other allergic rhinitis: Secondary | ICD-10-CM | POA: Diagnosis not present

## 2017-05-31 DIAGNOSIS — J301 Allergic rhinitis due to pollen: Secondary | ICD-10-CM | POA: Diagnosis not present

## 2017-06-07 DIAGNOSIS — J3089 Other allergic rhinitis: Secondary | ICD-10-CM | POA: Diagnosis not present

## 2017-06-07 DIAGNOSIS — J301 Allergic rhinitis due to pollen: Secondary | ICD-10-CM | POA: Diagnosis not present

## 2017-06-07 DIAGNOSIS — J3081 Allergic rhinitis due to animal (cat) (dog) hair and dander: Secondary | ICD-10-CM | POA: Diagnosis not present

## 2017-06-14 DIAGNOSIS — J3081 Allergic rhinitis due to animal (cat) (dog) hair and dander: Secondary | ICD-10-CM | POA: Diagnosis not present

## 2017-06-14 DIAGNOSIS — J3089 Other allergic rhinitis: Secondary | ICD-10-CM | POA: Diagnosis not present

## 2017-06-14 DIAGNOSIS — J301 Allergic rhinitis due to pollen: Secondary | ICD-10-CM | POA: Diagnosis not present

## 2017-06-19 DIAGNOSIS — J3089 Other allergic rhinitis: Secondary | ICD-10-CM | POA: Diagnosis not present

## 2017-06-21 DIAGNOSIS — J3081 Allergic rhinitis due to animal (cat) (dog) hair and dander: Secondary | ICD-10-CM | POA: Diagnosis not present

## 2017-06-21 DIAGNOSIS — J3089 Other allergic rhinitis: Secondary | ICD-10-CM | POA: Diagnosis not present

## 2017-06-21 DIAGNOSIS — J301 Allergic rhinitis due to pollen: Secondary | ICD-10-CM | POA: Diagnosis not present

## 2017-06-29 DIAGNOSIS — J301 Allergic rhinitis due to pollen: Secondary | ICD-10-CM | POA: Diagnosis not present

## 2017-06-29 DIAGNOSIS — J3089 Other allergic rhinitis: Secondary | ICD-10-CM | POA: Diagnosis not present

## 2017-06-29 DIAGNOSIS — J3081 Allergic rhinitis due to animal (cat) (dog) hair and dander: Secondary | ICD-10-CM | POA: Diagnosis not present

## 2017-07-03 DIAGNOSIS — J3089 Other allergic rhinitis: Secondary | ICD-10-CM | POA: Diagnosis not present

## 2017-07-03 DIAGNOSIS — J3081 Allergic rhinitis due to animal (cat) (dog) hair and dander: Secondary | ICD-10-CM | POA: Diagnosis not present

## 2017-07-03 DIAGNOSIS — J301 Allergic rhinitis due to pollen: Secondary | ICD-10-CM | POA: Diagnosis not present

## 2017-07-19 DIAGNOSIS — J301 Allergic rhinitis due to pollen: Secondary | ICD-10-CM | POA: Diagnosis not present

## 2017-07-19 DIAGNOSIS — J3089 Other allergic rhinitis: Secondary | ICD-10-CM | POA: Diagnosis not present

## 2017-07-19 DIAGNOSIS — J3081 Allergic rhinitis due to animal (cat) (dog) hair and dander: Secondary | ICD-10-CM | POA: Diagnosis not present

## 2017-08-14 DIAGNOSIS — J3081 Allergic rhinitis due to animal (cat) (dog) hair and dander: Secondary | ICD-10-CM | POA: Diagnosis not present

## 2017-08-14 DIAGNOSIS — J3089 Other allergic rhinitis: Secondary | ICD-10-CM | POA: Diagnosis not present

## 2017-08-14 DIAGNOSIS — J301 Allergic rhinitis due to pollen: Secondary | ICD-10-CM | POA: Diagnosis not present

## 2017-08-16 DIAGNOSIS — J301 Allergic rhinitis due to pollen: Secondary | ICD-10-CM | POA: Diagnosis not present

## 2017-08-16 DIAGNOSIS — J3089 Other allergic rhinitis: Secondary | ICD-10-CM | POA: Diagnosis not present

## 2017-08-16 DIAGNOSIS — J3081 Allergic rhinitis due to animal (cat) (dog) hair and dander: Secondary | ICD-10-CM | POA: Diagnosis not present

## 2017-08-23 DIAGNOSIS — J301 Allergic rhinitis due to pollen: Secondary | ICD-10-CM | POA: Diagnosis not present

## 2017-08-23 DIAGNOSIS — J3081 Allergic rhinitis due to animal (cat) (dog) hair and dander: Secondary | ICD-10-CM | POA: Diagnosis not present

## 2017-08-23 DIAGNOSIS — J3089 Other allergic rhinitis: Secondary | ICD-10-CM | POA: Diagnosis not present

## 2017-08-30 DIAGNOSIS — J3089 Other allergic rhinitis: Secondary | ICD-10-CM | POA: Diagnosis not present

## 2017-08-30 DIAGNOSIS — J301 Allergic rhinitis due to pollen: Secondary | ICD-10-CM | POA: Diagnosis not present

## 2017-08-30 DIAGNOSIS — J3081 Allergic rhinitis due to animal (cat) (dog) hair and dander: Secondary | ICD-10-CM | POA: Diagnosis not present

## 2017-09-13 DIAGNOSIS — J3081 Allergic rhinitis due to animal (cat) (dog) hair and dander: Secondary | ICD-10-CM | POA: Diagnosis not present

## 2017-09-13 DIAGNOSIS — J3089 Other allergic rhinitis: Secondary | ICD-10-CM | POA: Diagnosis not present

## 2017-09-13 DIAGNOSIS — J301 Allergic rhinitis due to pollen: Secondary | ICD-10-CM | POA: Diagnosis not present

## 2017-09-20 DIAGNOSIS — J3089 Other allergic rhinitis: Secondary | ICD-10-CM | POA: Diagnosis not present

## 2017-09-20 DIAGNOSIS — J3081 Allergic rhinitis due to animal (cat) (dog) hair and dander: Secondary | ICD-10-CM | POA: Diagnosis not present

## 2017-09-20 DIAGNOSIS — J301 Allergic rhinitis due to pollen: Secondary | ICD-10-CM | POA: Diagnosis not present

## 2017-09-27 DIAGNOSIS — J3089 Other allergic rhinitis: Secondary | ICD-10-CM | POA: Diagnosis not present

## 2017-09-27 DIAGNOSIS — J3081 Allergic rhinitis due to animal (cat) (dog) hair and dander: Secondary | ICD-10-CM | POA: Diagnosis not present

## 2017-09-27 DIAGNOSIS — J301 Allergic rhinitis due to pollen: Secondary | ICD-10-CM | POA: Diagnosis not present

## 2017-10-25 DIAGNOSIS — J3081 Allergic rhinitis due to animal (cat) (dog) hair and dander: Secondary | ICD-10-CM | POA: Diagnosis not present

## 2017-10-25 DIAGNOSIS — J3089 Other allergic rhinitis: Secondary | ICD-10-CM | POA: Diagnosis not present

## 2017-10-25 DIAGNOSIS — J301 Allergic rhinitis due to pollen: Secondary | ICD-10-CM | POA: Diagnosis not present

## 2017-10-30 DIAGNOSIS — J3089 Other allergic rhinitis: Secondary | ICD-10-CM | POA: Diagnosis not present

## 2017-10-30 DIAGNOSIS — J301 Allergic rhinitis due to pollen: Secondary | ICD-10-CM | POA: Diagnosis not present

## 2017-10-30 DIAGNOSIS — J3081 Allergic rhinitis due to animal (cat) (dog) hair and dander: Secondary | ICD-10-CM | POA: Diagnosis not present

## 2017-11-09 DIAGNOSIS — J301 Allergic rhinitis due to pollen: Secondary | ICD-10-CM | POA: Diagnosis not present

## 2017-11-09 DIAGNOSIS — J3081 Allergic rhinitis due to animal (cat) (dog) hair and dander: Secondary | ICD-10-CM | POA: Diagnosis not present

## 2017-11-09 DIAGNOSIS — J3089 Other allergic rhinitis: Secondary | ICD-10-CM | POA: Diagnosis not present

## 2017-11-20 DIAGNOSIS — J301 Allergic rhinitis due to pollen: Secondary | ICD-10-CM | POA: Diagnosis not present

## 2017-11-20 DIAGNOSIS — J3081 Allergic rhinitis due to animal (cat) (dog) hair and dander: Secondary | ICD-10-CM | POA: Diagnosis not present

## 2017-11-20 DIAGNOSIS — J3089 Other allergic rhinitis: Secondary | ICD-10-CM | POA: Diagnosis not present

## 2017-11-27 DIAGNOSIS — J301 Allergic rhinitis due to pollen: Secondary | ICD-10-CM | POA: Diagnosis not present

## 2017-11-27 DIAGNOSIS — J3081 Allergic rhinitis due to animal (cat) (dog) hair and dander: Secondary | ICD-10-CM | POA: Diagnosis not present

## 2017-11-27 DIAGNOSIS — J3089 Other allergic rhinitis: Secondary | ICD-10-CM | POA: Diagnosis not present

## 2017-11-29 ENCOUNTER — Other Ambulatory Visit: Payer: Self-pay | Admitting: Obstetrics & Gynecology

## 2017-11-29 DIAGNOSIS — Z1231 Encounter for screening mammogram for malignant neoplasm of breast: Secondary | ICD-10-CM

## 2017-12-11 DIAGNOSIS — J3081 Allergic rhinitis due to animal (cat) (dog) hair and dander: Secondary | ICD-10-CM | POA: Diagnosis not present

## 2017-12-11 DIAGNOSIS — J301 Allergic rhinitis due to pollen: Secondary | ICD-10-CM | POA: Diagnosis not present

## 2017-12-11 DIAGNOSIS — J3089 Other allergic rhinitis: Secondary | ICD-10-CM | POA: Diagnosis not present

## 2017-12-18 DIAGNOSIS — J3081 Allergic rhinitis due to animal (cat) (dog) hair and dander: Secondary | ICD-10-CM | POA: Diagnosis not present

## 2017-12-18 DIAGNOSIS — J3089 Other allergic rhinitis: Secondary | ICD-10-CM | POA: Diagnosis not present

## 2017-12-18 DIAGNOSIS — J301 Allergic rhinitis due to pollen: Secondary | ICD-10-CM | POA: Diagnosis not present

## 2017-12-21 ENCOUNTER — Other Ambulatory Visit: Payer: Self-pay | Admitting: Obstetrics & Gynecology

## 2017-12-21 DIAGNOSIS — R921 Mammographic calcification found on diagnostic imaging of breast: Secondary | ICD-10-CM

## 2018-01-06 DIAGNOSIS — Z23 Encounter for immunization: Secondary | ICD-10-CM | POA: Diagnosis not present

## 2018-01-08 DIAGNOSIS — J301 Allergic rhinitis due to pollen: Secondary | ICD-10-CM | POA: Diagnosis not present

## 2018-01-08 DIAGNOSIS — J3089 Other allergic rhinitis: Secondary | ICD-10-CM | POA: Diagnosis not present

## 2018-01-08 DIAGNOSIS — J3081 Allergic rhinitis due to animal (cat) (dog) hair and dander: Secondary | ICD-10-CM | POA: Diagnosis not present

## 2018-01-10 DIAGNOSIS — J301 Allergic rhinitis due to pollen: Secondary | ICD-10-CM | POA: Diagnosis not present

## 2018-01-10 DIAGNOSIS — J3089 Other allergic rhinitis: Secondary | ICD-10-CM | POA: Diagnosis not present

## 2018-01-10 DIAGNOSIS — J3081 Allergic rhinitis due to animal (cat) (dog) hair and dander: Secondary | ICD-10-CM | POA: Diagnosis not present

## 2018-01-15 DIAGNOSIS — J301 Allergic rhinitis due to pollen: Secondary | ICD-10-CM | POA: Diagnosis not present

## 2018-01-15 DIAGNOSIS — J3081 Allergic rhinitis due to animal (cat) (dog) hair and dander: Secondary | ICD-10-CM | POA: Diagnosis not present

## 2018-01-15 DIAGNOSIS — J3089 Other allergic rhinitis: Secondary | ICD-10-CM | POA: Diagnosis not present

## 2018-01-22 DIAGNOSIS — J3089 Other allergic rhinitis: Secondary | ICD-10-CM | POA: Diagnosis not present

## 2018-01-22 DIAGNOSIS — J3081 Allergic rhinitis due to animal (cat) (dog) hair and dander: Secondary | ICD-10-CM | POA: Diagnosis not present

## 2018-01-22 DIAGNOSIS — J301 Allergic rhinitis due to pollen: Secondary | ICD-10-CM | POA: Diagnosis not present

## 2018-01-25 ENCOUNTER — Other Ambulatory Visit: Payer: Self-pay | Admitting: Obstetrics & Gynecology

## 2018-01-25 ENCOUNTER — Ambulatory Visit
Admission: RE | Admit: 2018-01-25 | Discharge: 2018-01-25 | Disposition: A | Discharge status: Home / Self Care | Payer: BLUE CROSS/BLUE SHIELD | Source: Ambulatory Visit | Attending: Obstetrics & Gynecology | Admitting: Obstetrics & Gynecology

## 2018-01-25 DIAGNOSIS — N644 Mastodynia: Secondary | ICD-10-CM | POA: Diagnosis not present

## 2018-01-25 DIAGNOSIS — R922 Inconclusive mammogram: Secondary | ICD-10-CM | POA: Diagnosis not present

## 2018-01-25 DIAGNOSIS — R921 Mammographic calcification found on diagnostic imaging of breast: Secondary | ICD-10-CM

## 2018-01-29 DIAGNOSIS — J3089 Other allergic rhinitis: Secondary | ICD-10-CM | POA: Diagnosis not present

## 2018-01-29 DIAGNOSIS — J3081 Allergic rhinitis due to animal (cat) (dog) hair and dander: Secondary | ICD-10-CM | POA: Diagnosis not present

## 2018-01-29 DIAGNOSIS — J301 Allergic rhinitis due to pollen: Secondary | ICD-10-CM | POA: Diagnosis not present

## 2018-02-05 DIAGNOSIS — J3081 Allergic rhinitis due to animal (cat) (dog) hair and dander: Secondary | ICD-10-CM | POA: Diagnosis not present

## 2018-02-05 DIAGNOSIS — J301 Allergic rhinitis due to pollen: Secondary | ICD-10-CM | POA: Diagnosis not present

## 2018-02-05 DIAGNOSIS — J3089 Other allergic rhinitis: Secondary | ICD-10-CM | POA: Diagnosis not present

## 2018-02-12 DIAGNOSIS — Z6825 Body mass index (BMI) 25.0-25.9, adult: Secondary | ICD-10-CM | POA: Diagnosis not present

## 2018-02-12 DIAGNOSIS — Z01419 Encounter for gynecological examination (general) (routine) without abnormal findings: Secondary | ICD-10-CM | POA: Diagnosis not present

## 2018-02-15 LAB — HM PAP SMEAR: HM Pap smear: NEGATIVE

## 2018-03-05 DIAGNOSIS — J301 Allergic rhinitis due to pollen: Secondary | ICD-10-CM | POA: Diagnosis not present

## 2018-03-05 DIAGNOSIS — J3081 Allergic rhinitis due to animal (cat) (dog) hair and dander: Secondary | ICD-10-CM | POA: Diagnosis not present

## 2018-03-05 DIAGNOSIS — J3089 Other allergic rhinitis: Secondary | ICD-10-CM | POA: Diagnosis not present

## 2018-03-22 DIAGNOSIS — J301 Allergic rhinitis due to pollen: Secondary | ICD-10-CM | POA: Diagnosis not present

## 2018-03-22 DIAGNOSIS — J3081 Allergic rhinitis due to animal (cat) (dog) hair and dander: Secondary | ICD-10-CM | POA: Diagnosis not present

## 2018-03-22 DIAGNOSIS — J3089 Other allergic rhinitis: Secondary | ICD-10-CM | POA: Diagnosis not present

## 2018-03-26 DIAGNOSIS — L309 Dermatitis, unspecified: Secondary | ICD-10-CM | POA: Diagnosis not present

## 2018-03-26 DIAGNOSIS — J3089 Other allergic rhinitis: Secondary | ICD-10-CM | POA: Diagnosis not present

## 2018-03-26 DIAGNOSIS — J301 Allergic rhinitis due to pollen: Secondary | ICD-10-CM | POA: Diagnosis not present

## 2018-03-26 DIAGNOSIS — J3081 Allergic rhinitis due to animal (cat) (dog) hair and dander: Secondary | ICD-10-CM | POA: Diagnosis not present

## 2018-03-28 DIAGNOSIS — J3081 Allergic rhinitis due to animal (cat) (dog) hair and dander: Secondary | ICD-10-CM | POA: Diagnosis not present

## 2018-03-28 DIAGNOSIS — J3089 Other allergic rhinitis: Secondary | ICD-10-CM | POA: Diagnosis not present

## 2018-03-28 DIAGNOSIS — J301 Allergic rhinitis due to pollen: Secondary | ICD-10-CM | POA: Diagnosis not present

## 2018-04-02 DIAGNOSIS — J3081 Allergic rhinitis due to animal (cat) (dog) hair and dander: Secondary | ICD-10-CM | POA: Diagnosis not present

## 2018-04-02 DIAGNOSIS — J301 Allergic rhinitis due to pollen: Secondary | ICD-10-CM | POA: Diagnosis not present

## 2018-04-05 DIAGNOSIS — J3089 Other allergic rhinitis: Secondary | ICD-10-CM | POA: Diagnosis not present

## 2018-04-05 DIAGNOSIS — J3081 Allergic rhinitis due to animal (cat) (dog) hair and dander: Secondary | ICD-10-CM | POA: Diagnosis not present

## 2018-04-05 DIAGNOSIS — J301 Allergic rhinitis due to pollen: Secondary | ICD-10-CM | POA: Diagnosis not present

## 2018-04-23 DIAGNOSIS — J3081 Allergic rhinitis due to animal (cat) (dog) hair and dander: Secondary | ICD-10-CM | POA: Diagnosis not present

## 2018-04-23 DIAGNOSIS — J3089 Other allergic rhinitis: Secondary | ICD-10-CM | POA: Diagnosis not present

## 2018-04-23 DIAGNOSIS — J301 Allergic rhinitis due to pollen: Secondary | ICD-10-CM | POA: Diagnosis not present

## 2018-04-26 DIAGNOSIS — J301 Allergic rhinitis due to pollen: Secondary | ICD-10-CM | POA: Diagnosis not present

## 2018-04-26 DIAGNOSIS — J3089 Other allergic rhinitis: Secondary | ICD-10-CM | POA: Diagnosis not present

## 2018-04-26 DIAGNOSIS — J3081 Allergic rhinitis due to animal (cat) (dog) hair and dander: Secondary | ICD-10-CM | POA: Diagnosis not present

## 2018-05-03 ENCOUNTER — Encounter: Payer: Self-pay | Admitting: Family Medicine

## 2018-05-03 ENCOUNTER — Ambulatory Visit (INDEPENDENT_AMBULATORY_CARE_PROVIDER_SITE_OTHER): Payer: BLUE CROSS/BLUE SHIELD | Admitting: Family Medicine

## 2018-05-03 VITALS — BP 116/74 | HR 88 | Temp 97.9°F | Ht 66.0 in | Wt 153.8 lb

## 2018-05-03 DIAGNOSIS — Z01419 Encounter for gynecological examination (general) (routine) without abnormal findings: Secondary | ICD-10-CM

## 2018-05-03 DIAGNOSIS — R5383 Other fatigue: Secondary | ICD-10-CM | POA: Diagnosis not present

## 2018-05-03 DIAGNOSIS — Z0000 Encounter for general adult medical examination without abnormal findings: Secondary | ICD-10-CM

## 2018-05-03 DIAGNOSIS — E538 Deficiency of other specified B group vitamins: Secondary | ICD-10-CM | POA: Diagnosis not present

## 2018-05-03 DIAGNOSIS — Z13220 Encounter for screening for lipoid disorders: Secondary | ICD-10-CM | POA: Diagnosis not present

## 2018-05-03 DIAGNOSIS — Z23 Encounter for immunization: Secondary | ICD-10-CM

## 2018-05-03 DIAGNOSIS — E559 Vitamin D deficiency, unspecified: Secondary | ICD-10-CM | POA: Diagnosis not present

## 2018-05-03 NOTE — Patient Instructions (Addendum)
We'll start your hepatitis B series today, return for follow-up immunizations with the [facility], Female Adopting a healthy lifestyle and getting preventive care can go a long way to promote health and wellness. Talk with your health care provider about what schedule of regular examinations is right for you. This is a good chance for you to check in with your provider about disease prevention and staying healthy. In between checkups, there are plenty of things you can do on your own. Experts have done a lot of research about which lifestyle changes and preventive measures are most likely to keep you healthy. Ask your health care provider for more information. Weight and diet Eat a healthy diet  Be sure to include plenty of vegetables, fruits, low-fat dairy products, and lean protein.  Do not eat a lot of foods high in solid fats, added sugars, or salt.  Get regular exercise. This is one of the most important things you can do for your health. ? Most adults should exercise for at least 150 minutes each week. The exercise should increase your heart rate and make you sweat (moderate-intensity exercise). ? Most adults should also do strengthening exercises at least twice a week. This is in addition to the moderate-intensity exercise. Maintain a healthy weight  Body mass index (BMI) is a measurement that can be used to identify possible weight problems. It estimates body fat based on height and weight. Your health care provider can help determine your BMI and help you achieve or maintain a healthy weight.  For females 11 years of age and older: ? A BMI below 18.5 is considered underweight. ? A BMI of 18.5 to 24.9 is normal. ? A BMI of 25 to 29.9 is considered overweight. ? A BMI of 30 and above is considered obese. Watch levels of cholesterol and blood lipids  You should start having your blood tested for lipids and cholesterol at 49 years of age, then have this test every 5  years.  You may need to have your cholesterol levels checked more often if: ? Your lipid or cholesterol levels are high. ? You are older than 49 years of age. ? You are at high risk for heart disease. Cancer screening Lung Cancer  Lung cancer screening is recommended for adults 107-55 years old who are at high risk for lung cancer because of a history of smoking.  A yearly low-dose CT scan of the lungs is recommended for people who: ? Currently smoke. ? Have quit within the past 15 years. ? Have at least a 30-pack-year history of smoking. A pack year is smoking an average of one pack of cigarettes a day for 1 year.  Yearly screening should continue until it has been 15 years since you quit.  Yearly screening should stop if you develop a health problem that would prevent you from having lung cancer treatment. Breast Cancer  Practice breast self-awareness. This means understanding how your breasts normally appear and feel.  It also means doing regular breast self-exams. Let your health care provider know about any changes, no matter how small.  If you are in your 20s or 30s, you should have a clinical breast exam (CBE) by a health care provider every 1-3 years as part of a regular health exam.  If you are 21 or older, have a CBE every year. Also consider having a breast X-ray (mammogram) every year.  If you have a family history of breast cancer, talk to  your health care provider about genetic screening.  If you are at high risk for breast cancer, talk to your health care provider about having an MRI and a mammogram every year.  Breast cancer gene (BRCA) assessment is recommended for women who have family members with BRCA-related cancers. BRCA-related cancers include: ? Breast. ? Ovarian. ? Tubal. ? Peritoneal cancers.  Results of the assessment will determine the need for genetic counseling and BRCA1 and BRCA2 testing. Cervical Cancer Your health care provider may recommend  that you be screened regularly for cancer of the pelvic organs (ovaries, uterus, and vagina). This screening involves a pelvic examination, including checking for microscopic changes to the surface of your cervix (Pap test). You may be encouraged to have this screening done every 3 years, beginning at age 44.  For women ages 11-65, health care providers may recommend pelvic exams and Pap testing every 3 years, or they may recommend the Pap and pelvic exam, combined with testing for human papilloma virus (HPV), every 5 years. Some types of HPV increase your risk of cervical cancer. Testing for HPV may also be done on women of any age with unclear Pap test results.  Other health care providers may not recommend any screening for nonpregnant women who are considered low risk for pelvic cancer and who do not have symptoms. Ask your health care provider if a screening pelvic exam is right for you.  If you have had past treatment for cervical cancer or a condition that could lead to cancer, you need Pap tests and screening for cancer for at least 20 years after your treatment. If Pap tests have been discontinued, your risk factors (such as having a new sexual partner) need to be reassessed to determine if screening should resume. Some women have medical problems that increase the chance of getting cervical cancer. In these cases, your health care provider may recommend more frequent screening and Pap tests. Colorectal Cancer  This type of cancer can be detected and often prevented.  Routine colorectal cancer screening usually begins at 49 years of age and continues through 49 years of age.  Your health care provider may recommend screening at an earlier age if you have risk factors for colon cancer.  Your health care provider may also recommend using home test kits to check for hidden blood in the stool.  A small camera at the end of a tube can be used to examine your colon directly (sigmoidoscopy or  colonoscopy). This is done to check for the earliest forms of colorectal cancer.  Routine screening usually begins at age 60.  Direct examination of the colon should be repeated every 5-10 years through 49 years of age. However, you may need to be screened more often if early forms of precancerous polyps or small growths are found. Skin Cancer  Check your skin from head to toe regularly.  Tell your health care provider about any new moles or changes in moles, especially if there is a change in a mole's shape or color.  Also tell your health care provider if you have a mole that is larger than the size of a pencil eraser.  Always use sunscreen. Apply sunscreen liberally and repeatedly throughout the day.  Protect yourself by wearing long sleeves, pants, a wide-brimmed hat, and sunglasses whenever you are outside. Heart disease, diabetes, and high blood pressure  High blood pressure causes heart disease and increases the risk of stroke. High blood pressure is more likely to develop in: ?  People who have blood pressure in the high end of the normal range (130-139/85-89 mm Hg). ? People who are overweight or obese. ? People who are African American.  If you are 60-77 years of age, have your blood pressure checked every 3-5 years. If you are 21 years of age or older, have your blood pressure checked every year. You should have your blood pressure measured twice-once when you are at a hospital or clinic, and once when you are not at a hospital or clinic. Record the average of the two measurements. To check your blood pressure when you are not at a hospital or clinic, you can use: ? An automated blood pressure machine at a pharmacy. ? A home blood pressure monitor.  If you are between 43 years and 84 years old, ask your health care provider if you should take aspirin to prevent strokes.  Have regular diabetes screenings. This involves taking a blood sample to check your fasting blood sugar  level. ? If you are at a normal weight and have a low risk for diabetes, have this test once every three years after 49 years of age. ? If you are overweight and have a high risk for diabetes, consider being tested at a younger age or more often. Preventing infection Hepatitis B  If you have a higher risk for hepatitis B, you should be screened for this virus. You are considered at high risk for hepatitis B if: ? You were born in a country where hepatitis B is common. Ask your health care provider which countries are considered high risk. ? Your parents were born in a high-risk country, and you have not been immunized against hepatitis B (hepatitis B vaccine). ? You have HIV or AIDS. ? You use needles to inject street drugs. ? You live with someone who has hepatitis B. ? You have had sex with someone who has hepatitis B. ? You get hemodialysis treatment. ? You take certain medicines for conditions, including cancer, organ transplantation, and autoimmune conditions. Hepatitis C  Blood testing is recommended for: ? Everyone born from [birth year] through 1966. ? Anyone with known risk factors for hepatitis C. Sexually transmitted infections (STIs)  You should be screened for sexually transmitted infections (STIs) including gonorrhea and chlamydia if: ? You are sexually active and are younger than 49 years of age. ? You are older than 49 years of age and your health care provider tells you that you are at risk for this type of infection. ? Your sexual activity has changed since you were last screened and you are at an increased risk for chlamydia or gonorrhea. Ask your health care provider if you are at risk.  If you do not have HIV, but are at risk, it may be recommended that you take a prescription medicine daily to prevent HIV infection. This is called pre-exposure prophylaxis (PrEP). You are considered at risk if: ? You are sexually active and do not regularly use condoms or know the HIV status  of your partner(s). ? You take drugs by injection. ? You are sexually active with a partner who has HIV. Talk with your health care provider about whether you are at high risk of being infected with HIV. If you choose to begin PrEP, you should first be tested for HIV. You should then be tested every 3 months for as long as you are taking PrEP. Pregnancy  If you are premenopausal and you may become pregnant, ask your health care provider about  preconception counseling.  If you may become pregnant, take 400 to 800 micrograms (mcg) of folic acid every day.  If you want to prevent pregnancy, talk to your health care provider about birth control (contraception). Osteoporosis and menopause  Osteoporosis is a disease in which the bones lose minerals and strength with aging. This can result in serious bone fractures. Your risk for osteoporosis can be identified using a bone density scan.  If you are 45 years of age or older, or if you are at risk for osteoporosis and fractures, ask your health care provider if you should be screened.  Ask your health care provider whether you should take a calcium or vitamin D supplement to lower your risk for osteoporosis.  Menopause may have certain physical symptoms and risks.  Hormone replacement therapy may reduce some of these symptoms and risks. Talk to your health care provider about whether hormone replacement therapy is right for you. Follow these instructions at home:  Schedule regular health, dental, and eye exams.  Stay current with your immunizations.  Do not use any tobacco products including cigarettes, chewing tobacco, or electronic cigarettes.  If you are pregnant, do not drink alcohol.  If you are breastfeeding, limit how much and how often you drink alcohol.  Limit alcohol intake to no more than 1 drink per day for nonpregnant women. One drink equals 12 ounces of beer, 5 ounces of wine, or 1 ounces of hard liquor.  Do not use street  drugs.  Do not share needles.  Ask your health care provider for help if you need support or information about quitting drugs.  Tell your health care provider if you often feel depressed.  Tell your health care provider if you have ever been abused or do not feel safe at home. This information is not intended to replace advice given to you by your health care provider. Make sure you discuss any questions you have with your health care provider. Document Released: 10/17/2010 Document Revised: 09/09/2015 Document Reviewed: 01/05/2015 Elsevier Interactive Patient Education  2019 [employer].

## 2018-05-03 NOTE — Progress Notes (Signed)
Patient ID: Erica Weber, female   DOB: [birth year], 48 y.o.   MRN: [id number]   Subjective:   Erica Weber is a 48 y.o. female here for a complete physical exam  Interim issues since last visit: here for CPE, no medical excitement Gyn does well woman components  USPSTF grade A and B recommendations Depression:  Depression screen [facility] 2/9 05/03/2018 04/27/2017 04/06/2016 11/23/2014  Decreased Interest 0 0 0 0  Down, Depressed, Hopeless 0 0 0 0  PHQ - 2 Score 0 0 0 0  Altered sleeping 0 - - -  Tired, decreased energy 0 - - -  Change in appetite 0 - - -  Feeling bad or failure about yourself  0 - - -  Trouble concentrating 0 - - -  Moving slowly or fidgety/restless 0 - - -  Suicidal thoughts 0 - - -  PHQ-9 Score 0 - - -  Difficult doing work/chores Not difficult at all - - -   Hypertension: BP Readings from Last 3 Encounters:  05/03/18 116/74  04/27/17 112/72  04/06/16 110/72   Obesity: Wt Readings from Last 3 Encounters:  05/03/18 153 lb 12.8 oz (69.8 kg)  04/27/17 148 lb 12.8 oz (67.5 kg)  04/06/16 152 lb (68.9 kg)   BMI Readings from Last 3 Encounters:  05/03/18 24.82 kg/m  04/27/17 24.02 kg/m  04/06/16 24.53 kg/m    Skin cancer: nothing worrisome; gets checked by derm once a year Lung cancer:  Smoked a little bit in early 20s, really nonsmoker Breast cancer: was having pain in the right breast, did more in-depth mammo; mainly drinks caffeine-free, just fibrotic tissue; perimenopause right now Colorectal cancer: start at 49 years of age right now, but watch news Cervical cancer screening: managed by GYN BRCA gene screening: family hx of breast and/or ovarian cancer and/or metastatic prostate cancer? Paternal grandmother had breast cancer; paternal great uncle had breast cancer (GM's brother); not interested in testing; father has not had cancer HIV, hep B, hep C: not interested, but wouuld like hep B series, last year found out she was not immune; works as a [employer], operational STD testing and prevention (chl/gon/syphilis): not interested Intimate partner violence: no abuse Contraception: no contraception right now; last period was Sept or Oct, spacing out more; caution given Osteoporosis: no steroids Fall prevention/vitamin D: discussed Low vit D in the last (x2) Immunizations: hep B series starting today Diet: fruits and veggies; will increase calcium, dark green leafies Exercise: will try to increase Alcohol:    Office Visit from 05/03/2018 in [facility]  AUDIT-C Score  2     Vit B12 low too Tobacco use: never really AAA: n/a Aspirin: n/a Glucose:  Glucose  Date Value Ref Range Status  11/25/2014 85 65 - 99 mg/dL Final   Glucose, Bld  Date Value Ref Range Status  04/27/2017 84 65 - 99 mg/dL Final    Comment:    .            Fasting reference interval .   04/06/2016 80 65 - 99 mg/dL Final   Lipids:  Lab Results  Component Value Date   CHOL 146 04/27/2017   CHOL 150 04/06/2016   CHOL 142 11/25/2014   Lab Results  Component Value Date   HDL 53 04/27/2017   HDL 51 04/06/2016   HDL 55 11/25/2014   Lab Results  Component Value Date   LDLCALC 72 04/27/2017   [place] 74 04/06/2016  LDLCALC 73 11/25/2014   Lab Results  Component Value Date   TRIG 129 04/27/2017   TRIG 123 04/06/2016   TRIG 71 11/25/2014   Lab Results  Component Value Date   CHOLHDL 2.8 04/27/2017   CHOLHDL 2.9 04/06/2016   CHOLHDL 2.6 11/25/2014   No results found for: LDLDIRECT   Past Medical History:  Diagnosis Date  . Allergy   . Asthma   . Dysplastic nevus 04/27/2017   Right leg, left shoulder, left thigh, sees dermatologist, Dr. Ree Edman  . Heart murmur    Past Surgical History:  Procedure Laterality Date  . BREAST BIOPSY Right 03/06/2016   benign  . BREAST SURGERY Bilateral 2000   Breast Reduction  . CHOLECYSTECTOMY  1999  . LIPOSUCTION  2012   Fat deposit in back  . REDUCTION MAMMAPLASTY Bilateral     1999   Family History  Problem Relation Age of Onset  . Cancer Mother        lung  . Diabetes Mother   . Stroke Father   . Hyperlipidemia Father   . Down syndrome Son   . Breast cancer Maternal Grandmother   . Breast cancer Paternal Grandmother    Social History   Tobacco Use  . Smoking status: Never Smoker  . Smokeless tobacco: Never Used  Substance Use Topics  . Alcohol use: Yes    Alcohol/week: 3.0 - 4.0 standard drinks    Types: 3 - 4 Standard drinks or equivalent per week  . Drug use: No   Review of Systems  Constitutional: Negative for unexpected weight change (watching calories, hormonal issues).  HENT: Negative for hearing loss.   Eyes: Negative for visual disturbance (readers).  Respiratory: Negative for wheezing.   Cardiovascular: Negative for chest pain.  Gastrointestinal: Negative for blood in stool.  Genitourinary: Negative for hematuria.  Musculoskeletal: Negative for joint swelling.       Joints are more creaky  Skin: Negative for rash.       Occasional eczema  Neurological: Negative for tremors.  Hematological: Negative for adenopathy.    Objective:   Vitals:   05/03/18 1100  BP: 116/74  Pulse: 88  Temp: 97.9 F (36.6 C)  TempSrc: Oral  SpO2: 99%  Weight: 153 lb 12.8 oz (69.8 kg)  Height: 5' 6"  (1.676 m)   Body mass index is 24.82 kg/m. Wt Readings from Last 3 Encounters:  05/03/18 153 lb 12.8 oz (69.8 kg)  04/27/17 148 lb 12.8 oz (67.5 kg)  04/06/16 152 lb (68.9 kg)   Physical Exam Constitutional:      Appearance: Normal appearance. She is well-developed.  HENT:     Right Ear: Hearing, tympanic membrane, ear canal and external ear normal.     Left Ear: Hearing, tympanic membrane, ear canal and external ear normal.     Mouth/Throat:     Pharynx: No posterior oropharyngeal erythema.  Eyes:     General: No scleral icterus.       Right eye: No hordeolum.        Left eye: No hordeolum.     Conjunctiva/sclera: Conjunctivae normal.   Neck:     Thyroid: No thyromegaly.     Vascular: No carotid bruit.  Cardiovascular:     Rate and Rhythm: Normal rate and regular rhythm.  No extrasystoles are present.    Heart sounds: Normal heart sounds, S1 normal and S2 normal.  Pulmonary:     Effort: Pulmonary effort is normal. No respiratory distress.  Breath sounds: Normal breath sounds.  Abdominal:     General: Bowel sounds are normal. There is no distension or abdominal bruit.     Palpations: Abdomen is soft. There is no mass or pulsatile mass.     Tenderness: There is no abdominal tenderness.     Hernia: No hernia is present.  Musculoskeletal: Normal range of motion.  Lymphadenopathy:     Head:     Right side of head: No submandibular adenopathy.     Left side of head: No submandibular adenopathy.     Cervical: No cervical adenopathy.  Skin:    General: Skin is warm and dry.     Coloration: Skin is not pale.     Findings: No bruising or ecchymosis.  Neurological:     Mental Status: She is alert.     Motor: No tremor or abnormal muscle tone.     Gait: Gait normal.     Deep Tendon Reflexes:     Reflex Scores:      Patellar reflexes are 2+ on the right side and 2+ on the left side. Psychiatric:        Mood and Affect: Mood is not anxious or depressed.        Speech: Speech normal.        Behavior: Behavior normal.        Thought Content: Thought content normal.     Assessment/Plan:   Problem List Items Addressed This Visit      Other   Vitamin D deficiency   Relevant Orders   VITAMIN D 25 Hydroxy (Vit-D Deficiency, Fractures)   Vitamin B12 deficiency   Relevant Orders   Vitamin B12   Preventative health care - Primary    USPSTF grade A and B recommendations reviewed with patient; age-appropriate recommendations, preventive care, screening tests, etc discussed and encouraged; healthy living encouraged; see AVS for patient education given to patient        Other Visit Diagnoses    Well woman exam with  routine gynecological exam       GYN exam not done, but could not remove code since already linked to labs   Relevant Orders   CBC   Comprehensive metabolic panel   Lipid panel   TSH   Need for hepatitis B vaccination       given by staff today; they instructed patient on when to return for follow up shots       No orders of the defined types were placed in this encounter.  Orders Placed This Encounter  Procedures  . Heplisav-B (HepB-CPG) Vaccine  . CBC  . Comprehensive metabolic panel  . Lipid panel  . TSH  . VITAMIN D 25 Hydroxy (Vit-D Deficiency, Fractures)  . Vitamin B12    Follow up plan: Return in 1 year (on 05/04/2019) for complete physical.  An After Visit Summary was printed and given to the patient.

## 2018-05-03 NOTE — Assessment & Plan Note (Signed)
USPSTF grade A and B recommendations reviewed with patient; age-appropriate recommendations, preventive care, screening tests, etc discussed and encouraged; healthy living encouraged; see AVS for patient education given to patient  

## 2018-05-03 NOTE — Progress Notes (Signed)
Greetings. It was a pleasure to see you today. Your complete blood count is normal. You have normal numbers of white blood cells, red blood cells, and platelets. The other labs are pending. Peace, Dr. Jaquelyne Firkus

## 2018-05-04 LAB — COMPREHENSIVE METABOLIC PANEL
AG RATIO: 2.4 (calc) (ref 1.0–2.5)
ALT: 13 U/L (ref 6–29)
AST: 17 U/L (ref 10–35)
Albumin: 4.5 g/dL (ref 3.6–5.1)
Alkaline phosphatase (APISO): 51 U/L (ref 33–115)
BUN/Creatinine Ratio: 10 (calc) (ref 6–22)
BUN: 6 mg/dL — ABNORMAL LOW (ref 7–25)
CO2: 29 mmol/L (ref 20–32)
Calcium: 9.4 mg/dL (ref 8.6–10.2)
Chloride: 102 mmol/L (ref 98–110)
Creat: 0.61 mg/dL (ref 0.50–1.10)
GLUCOSE: 76 mg/dL (ref 65–99)
Globulin: 1.9 g/dL (calc) (ref 1.9–3.7)
Potassium: 4.5 mmol/L (ref 3.5–5.3)
Sodium: 138 mmol/L (ref 135–146)
Total Bilirubin: 0.6 mg/dL (ref 0.2–1.2)
Total Protein: 6.4 g/dL (ref 6.1–8.1)

## 2018-05-04 LAB — LIPID PANEL
Cholesterol: 145 mg/dL (ref <200)
HDL: 50 mg/dL — ABNORMAL LOW (ref >50)
LDL Cholesterol (Calc): 77 mg/dL (calc)
Non-HDL Cholesterol (Calc): 95 mg/dL (calc) (ref <130)
Total CHOL/HDL Ratio: 2.9 (calc) (ref <5.0)
Triglycerides: 96 mg/dL (ref <150)

## 2018-05-04 LAB — CBC
HCT: 38.6 % (ref 35.0–45.0)
Hemoglobin: 13.2 g/dL (ref 11.7–15.5)
MCH: 30.8 pg (ref 27.0–33.0)
MCHC: 34.2 g/dL (ref 32.0–36.0)
MCV: 90.2 fL (ref 80.0–100.0)
MPV: 10.1 fL (ref 7.5–12.5)
Platelets: 269 10*3/uL (ref 140–400)
RBC: 4.28 10*6/uL (ref 3.80–5.10)
RDW: 12.7 % (ref 11.0–15.0)
WBC: 6.3 10*3/uL (ref 3.8–10.8)

## 2018-05-04 LAB — VITAMIN D 25 HYDROXY (VIT D DEFICIENCY, FRACTURES): Vit D, 25-Hydroxy: 30 ng/mL (ref 30–100)

## 2018-05-04 LAB — TSH: TSH: 1.9 mIU/L

## 2018-05-04 LAB — VITAMIN B12: Vitamin B-12: 559 pg/mL (ref 200–1100)

## 2018-05-10 DIAGNOSIS — J3089 Other allergic rhinitis: Secondary | ICD-10-CM | POA: Diagnosis not present

## 2018-05-10 DIAGNOSIS — J301 Allergic rhinitis due to pollen: Secondary | ICD-10-CM | POA: Diagnosis not present

## 2018-05-10 DIAGNOSIS — J3081 Allergic rhinitis due to animal (cat) (dog) hair and dander: Secondary | ICD-10-CM | POA: Diagnosis not present

## 2018-05-17 DIAGNOSIS — J3089 Other allergic rhinitis: Secondary | ICD-10-CM | POA: Diagnosis not present

## 2018-05-17 DIAGNOSIS — J3081 Allergic rhinitis due to animal (cat) (dog) hair and dander: Secondary | ICD-10-CM | POA: Diagnosis not present

## 2018-05-17 DIAGNOSIS — J301 Allergic rhinitis due to pollen: Secondary | ICD-10-CM | POA: Diagnosis not present

## 2018-05-20 ENCOUNTER — Encounter: Payer: Self-pay | Admitting: Family Medicine

## 2018-05-31 DIAGNOSIS — J3081 Allergic rhinitis due to animal (cat) (dog) hair and dander: Secondary | ICD-10-CM | POA: Diagnosis not present

## 2018-05-31 DIAGNOSIS — J301 Allergic rhinitis due to pollen: Secondary | ICD-10-CM | POA: Diagnosis not present

## 2018-05-31 DIAGNOSIS — J3089 Other allergic rhinitis: Secondary | ICD-10-CM | POA: Diagnosis not present

## 2018-06-14 ENCOUNTER — Ambulatory Visit: Payer: BLUE CROSS/BLUE SHIELD

## 2018-06-14 DIAGNOSIS — L2089 Other atopic dermatitis: Secondary | ICD-10-CM | POA: Diagnosis not present

## 2018-06-14 DIAGNOSIS — J3089 Other allergic rhinitis: Secondary | ICD-10-CM | POA: Diagnosis not present

## 2018-06-14 DIAGNOSIS — J301 Allergic rhinitis due to pollen: Secondary | ICD-10-CM | POA: Diagnosis not present

## 2018-06-14 DIAGNOSIS — J3081 Allergic rhinitis due to animal (cat) (dog) hair and dander: Secondary | ICD-10-CM | POA: Diagnosis not present

## 2018-06-20 DIAGNOSIS — J3089 Other allergic rhinitis: Secondary | ICD-10-CM | POA: Diagnosis not present

## 2018-06-20 DIAGNOSIS — J3081 Allergic rhinitis due to animal (cat) (dog) hair and dander: Secondary | ICD-10-CM | POA: Diagnosis not present

## 2018-06-20 DIAGNOSIS — J301 Allergic rhinitis due to pollen: Secondary | ICD-10-CM | POA: Diagnosis not present

## 2018-06-21 ENCOUNTER — Ambulatory Visit (INDEPENDENT_AMBULATORY_CARE_PROVIDER_SITE_OTHER): Payer: BLUE CROSS/BLUE SHIELD

## 2018-06-21 DIAGNOSIS — Z23 Encounter for immunization: Secondary | ICD-10-CM | POA: Diagnosis not present

## 2018-06-21 DIAGNOSIS — Z111 Encounter for screening for respiratory tuberculosis: Secondary | ICD-10-CM | POA: Diagnosis not present

## 2018-06-23 LAB — QUANTIFERON-TB GOLD PLUS
Mitogen-NIL: >10 IU/mL
NIL: 0.01 IU/mL
QuantiFERON-TB Gold Plus: NEGATIVE
TB1-NIL: 0 IU/mL
TB2-NIL: 0 IU/mL

## 2018-06-28 DIAGNOSIS — J3081 Allergic rhinitis due to animal (cat) (dog) hair and dander: Secondary | ICD-10-CM | POA: Diagnosis not present

## 2018-06-28 DIAGNOSIS — J3089 Other allergic rhinitis: Secondary | ICD-10-CM | POA: Diagnosis not present

## 2018-06-28 DIAGNOSIS — J301 Allergic rhinitis due to pollen: Secondary | ICD-10-CM | POA: Diagnosis not present

## 2018-12-05 DIAGNOSIS — B029 Zoster without complications: Secondary | ICD-10-CM | POA: Diagnosis not present

## 2019-01-23 ENCOUNTER — Ambulatory Visit (INDEPENDENT_AMBULATORY_CARE_PROVIDER_SITE_OTHER): Payer: BC Managed Care – PPO

## 2019-01-23 ENCOUNTER — Other Ambulatory Visit: Payer: Self-pay

## 2019-01-23 DIAGNOSIS — Z23 Encounter for immunization: Secondary | ICD-10-CM

## 2019-02-18 NOTE — Progress Notes (Signed)
PCP:  Arnetha Courser, MD   Chief Complaint  Patient presents with  . Gynecologic Exam     HPI:      Ms. Erica Weber is a 48 y.o. No obstetric history on file. who LMP was No LMP recorded. (Menstrual status: Perimenopausal)., presents today for her NP annual examination.  Her menses are infrequent since last yr due to perimenopause, lasting 5-7 days. Used to be monthly. Dysmenorrhea none. She does not have intermenstrual bleeding. Has night sweats/insomnia.   Sex activity: single partner, no vag dryness. Having decreased libido.  Last Pap: February 12, 2018  Results were: no abnormalities /neg HPV DNA . No hx of abn.   Last mammogram: January 25, 2018  Results were: normal--routine follow-up in 12 months. S/p RT breast calcification bx 2017. Stable on last mammo.  There is a FH of breast cancer in her MGM, PGM and pat grt uncle, genetic testing not done. There is either a FH of ovarian vs uterine cancer in mat aunt. The patient does not do self-breast exams.  Tobacco use: The patient denies current or previous tobacco use. Alcohol use: social drinker No drug use.  Exercise: moderately active  She does get adequate calcium and Vitamin D in her diet. Labs with PCP  Has almost daily bloating, not progressing. Hx of constipation. Bloating temporarily improved with BM. Is not taking probiotics. Hasn't tried increased fiber.   Patient Active Problem List   Diagnosis Date Noted  . Slow transit constipation 02/19/2019  . Bloating 02/19/2019  . Family history of breast cancer 02/19/2019  . Dysplastic nevus 04/27/2017  . Vitamin D deficiency 04/07/2016  . Vitamin B12 deficiency 04/07/2016  . Fatigue 04/06/2016  . Preventative health care 04/06/2016  . Asymptomatic PVCs 11/23/2014  . Allergic rhinitis with postnasal drip 11/23/2014  . Eczema of left external ear 11/23/2014  . Screening cholesterol level 11/23/2014  . Screening for tuberculosis 11/23/2014    Past Surgical  History:  Procedure Laterality Date  . BREAST BIOPSY Right 03/06/2016   benign  . BREAST SURGERY Bilateral 2000   Breast Reduction  . CHOLECYSTECTOMY  1999  . LIPOSUCTION  2012   Fat deposit in back  . REDUCTION MAMMAPLASTY Bilateral    1999    Family History  Problem Relation Age of Onset  . Cancer Mother        lung  . Diabetes Mother   . Stroke Father   . Hyperlipidemia Father   . Down syndrome Son   . Breast cancer Maternal Grandmother        60s  . Breast cancer Paternal Grandmother        15s  . Breast cancer Other   . Cancer Maternal Aunt        ovar vs uterine    Social History   Socioeconomic History  . Marital status: Married    Spouse name: sanuel  . Number of children: 1  . Years of education: 73  . Highest education level: Master's degree (e.g., MA, MS, MEng, MEd, MSW, MBA)  Occupational History  . Not on file  Social Needs  . Financial resource strain: Not hard at all  . Food insecurity    Worry: Never true    Inability: Never true  . Transportation needs    Medical: No    Non-medical: No  Tobacco Use  . Smoking status: Never Smoker  . Smokeless tobacco: Never Used  Substance and Sexual Activity  . Alcohol use:  Yes    Alcohol/week: 3.0 - 4.0 standard drinks    Types: 3 - 4 Standard drinks or equivalent per week  . Drug use: No  . Sexual activity: Yes    Partners: Male    Birth control/protection: None  Lifestyle  . Physical activity    Days per week: 0 days    Minutes per session: 0 min  . Stress: Not at all  Relationships  . Social Herbalist on phone: Twice a week    Gets together: Twice a week    Attends religious service: More than 4 times per year    Active member of club or organization: Yes    Attends meetings of clubs or organizations: More than 4 times per year    Relationship status: Married  . Intimate partner violence    Fear of current or ex partner: No    Emotionally abused: No    Physically abused: No     Forced sexual activity: No  Other Topics Concern  . Not on file  Social History Narrative  . Not on file     Current Outpatient Medications:  .  cholecalciferol (VITAMIN D) 1000 units tablet, Take 1,000 Units by mouth daily., Disp: , Rfl:  .  fluticasone (FLONASE) 50 MCG/ACT nasal spray, Place 1 spray into both nostrils daily., Disp: , Rfl:  .  loratadine (CLARITIN) 10 MG tablet, Take 10 mg by mouth daily as needed for allergies., Disp: , Rfl:  .  mupirocin cream (BACTROBAN) 2 %, Apply 1 application topically 2 (two) times daily., Disp: 15 g, Rfl: 0 .  vitamin B-12 (CYANOCOBALAMIN) 100 MCG tablet, Take 100 mcg by mouth daily., Disp: , Rfl:      ROS:  Review of Systems  Constitutional: Negative for fatigue, fever and unexpected weight change.  Respiratory: Negative for cough, shortness of breath and wheezing.   Cardiovascular: Negative for chest pain, palpitations and leg swelling.  Gastrointestinal: Positive for abdominal distention and constipation. Negative for blood in stool, diarrhea, nausea and vomiting.  Endocrine: Negative for cold intolerance, heat intolerance and polyuria.  Genitourinary: Negative for dyspareunia, dysuria, flank pain, frequency, genital sores, hematuria, menstrual problem, pelvic pain, urgency, vaginal bleeding, vaginal discharge and vaginal pain.  Musculoskeletal: Positive for arthralgias. Negative for back pain, joint swelling and myalgias.  Skin: Negative for rash.  Neurological: Negative for dizziness, syncope, light-headedness, numbness and headaches.  Hematological: Negative for adenopathy.  Psychiatric/Behavioral: Negative for agitation, confusion, sleep disturbance and suicidal ideas. The patient is not nervous/anxious.    BREAST: tenderness   Objective: BP 110/80   Ht 5\' 6"  (1.676 m)   Wt 159 lb (72.1 kg)   BMI 25.66 kg/m    Physical Exam Constitutional:      Appearance: She is well-developed.  Genitourinary:     Vulva, vagina,  cervix, uterus, right adnexa and left adnexa normal.     No vulval lesion or tenderness noted.     No vaginal discharge, erythema or tenderness.     No cervical polyp.     Uterus is not enlarged or tender.     No right or left adnexal mass present.     Right adnexa not tender.     Left adnexa not tender.  Neck:     Musculoskeletal: Normal range of motion.     Thyroid: No thyromegaly.  Cardiovascular:     Rate and Rhythm: Normal rate and regular rhythm.     Heart sounds: Normal heart  sounds. No murmur.  Pulmonary:     Effort: Pulmonary effort is normal.     Breath sounds: Normal breath sounds.  Chest:     Breasts:        Right: No mass, nipple discharge, skin change or tenderness.        Left: No mass, nipple discharge, skin change or tenderness.  Abdominal:     Palpations: Abdomen is soft.     Tenderness: There is no abdominal tenderness. There is no guarding.  Musculoskeletal: Normal range of motion.  Neurological:     General: No focal deficit present.     Mental Status: She is alert and oriented to person, place, and time.     Cranial Nerves: No cranial nerve deficit.  Skin:    General: Skin is warm and dry.  Psychiatric:        Mood and Affect: Mood normal.        Behavior: Behavior normal.        Thought Content: Thought content normal.        Judgment: Judgment normal.  Vitals signs reviewed.     Assessment/Plan: Encounter for annual routine gynecological examination  Encounter for screening mammogram for malignant neoplasm of breast - Plan: MM 3D SCREEN BREAST BILATERAL; pt to sched mammo. Does in [place].  Family history of breast cancer - Plan: MM 3D SCREEN BREAST BILATERAL; MyRisk testing discussed and handout given. Pt to f/u if desires.   Bloating--Most likely related to constipation. Add fiber/lots of water. Also discussed GYN u/s to rule out ovar path, especially given FH. Pt to do fiber first. If no sx change in several wks, will sched u/s.   Slow transit  constipation--Start fiber, add probioitics, increase water, cont exercise.   Perimenopause--f/u prn DUB. Touched on HRT for vasomotor sx. Pt to f/u prn.     GYN counsel breast self exam, mammography screening, menopause, adequate intake of calcium and vitamin D, diet and exercise     F/U  Return in about 1 year (around 02/19/2020).  Koby Pickup B. Anayansi Rundquist, PA-C 02/19/2019 1:32 PM

## 2019-02-19 ENCOUNTER — Encounter: Payer: Self-pay | Admitting: Obstetrics and Gynecology

## 2019-02-19 ENCOUNTER — Other Ambulatory Visit: Payer: Self-pay

## 2019-02-19 ENCOUNTER — Ambulatory Visit (INDEPENDENT_AMBULATORY_CARE_PROVIDER_SITE_OTHER): Payer: BC Managed Care – PPO | Admitting: Obstetrics and Gynecology

## 2019-02-19 VITALS — BP 110/80 | Ht 66.0 in | Wt 159.0 lb

## 2019-02-19 DIAGNOSIS — K5901 Slow transit constipation: Secondary | ICD-10-CM | POA: Insufficient documentation

## 2019-02-19 DIAGNOSIS — Z01419 Encounter for gynecological examination (general) (routine) without abnormal findings: Secondary | ICD-10-CM

## 2019-02-19 DIAGNOSIS — Z1231 Encounter for screening mammogram for malignant neoplasm of breast: Secondary | ICD-10-CM

## 2019-02-19 DIAGNOSIS — R140 Abdominal distension (gaseous): Secondary | ICD-10-CM | POA: Insufficient documentation

## 2019-02-19 DIAGNOSIS — Z803 Family history of malignant neoplasm of breast: Secondary | ICD-10-CM | POA: Insufficient documentation

## 2019-02-19 NOTE — Patient Instructions (Signed)
I value your feedback and entrusting us with your care. If you get a [facility] patient survey, I would appreciate you taking the time to let us know about your experience today. Thank you!  [facility]: [phone]    

## 2019-03-06 ENCOUNTER — Ambulatory Visit: Payer: BC Managed Care – PPO | Admitting: Family Medicine

## 2019-04-02 IMAGING — MG DIGITAL DIAGNOSTIC BILATERAL MAMMOGRAM WITH TOMO AND CAD
8 of 14 series · 8 of 38 positions shown · non-contrast
Comparison: Previous exam(s).

CLINICAL DATA: Intermittent focal pain in the outer right breast
radiating to the nipple the last 1.5 months. Follow-up previously
biopsied benign right breast calcifications. Right breast swelling
during her menstrual periods.

EXAM:
DIGITAL DIAGNOSTIC BILATERAL MAMMOGRAM WITH CAD AND TOMO
ULTRASOUND RIGHT BREAST

[R ML]
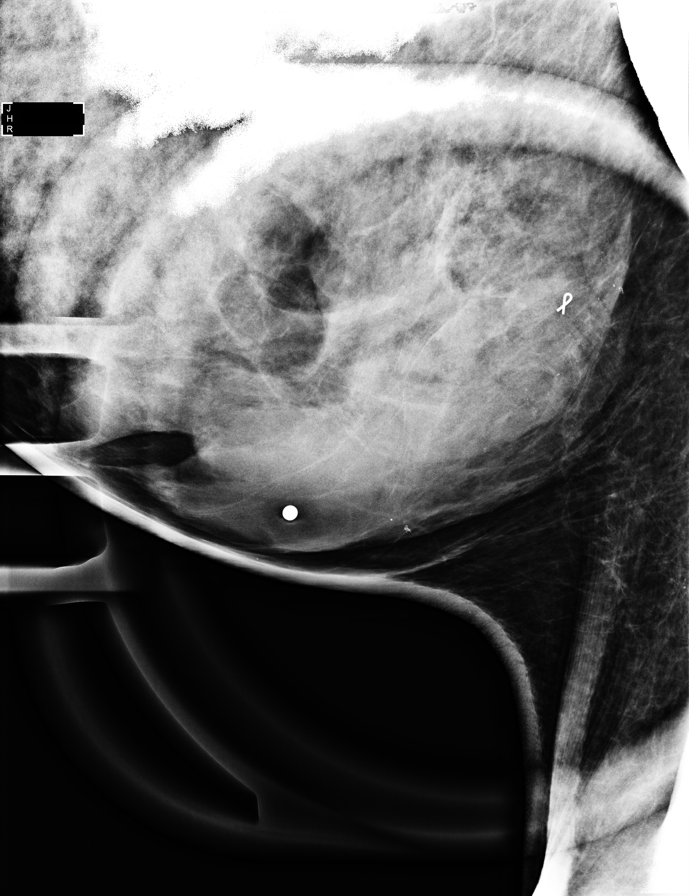

[R CC]
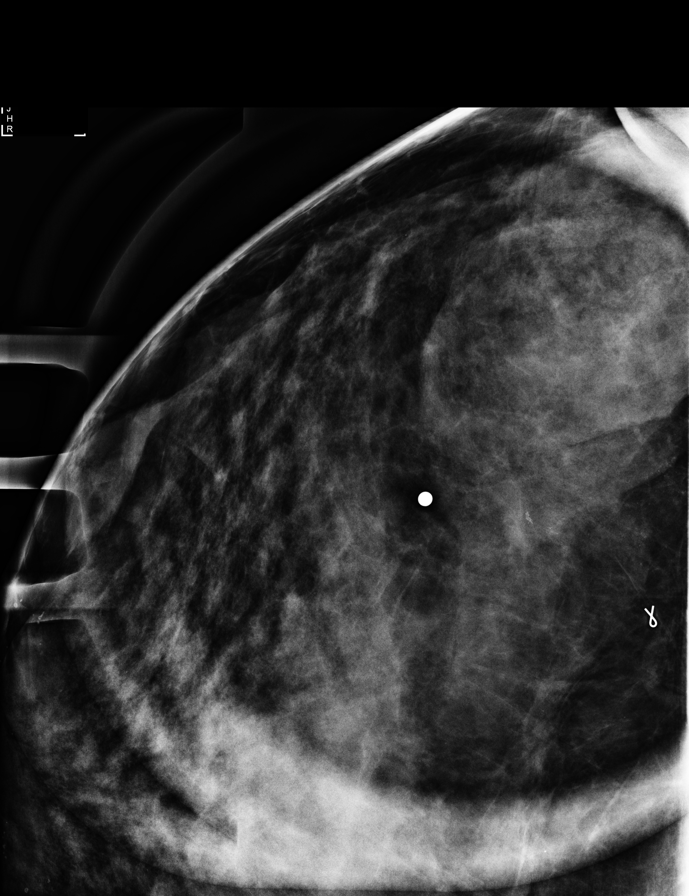

[R MLO synth-2D]
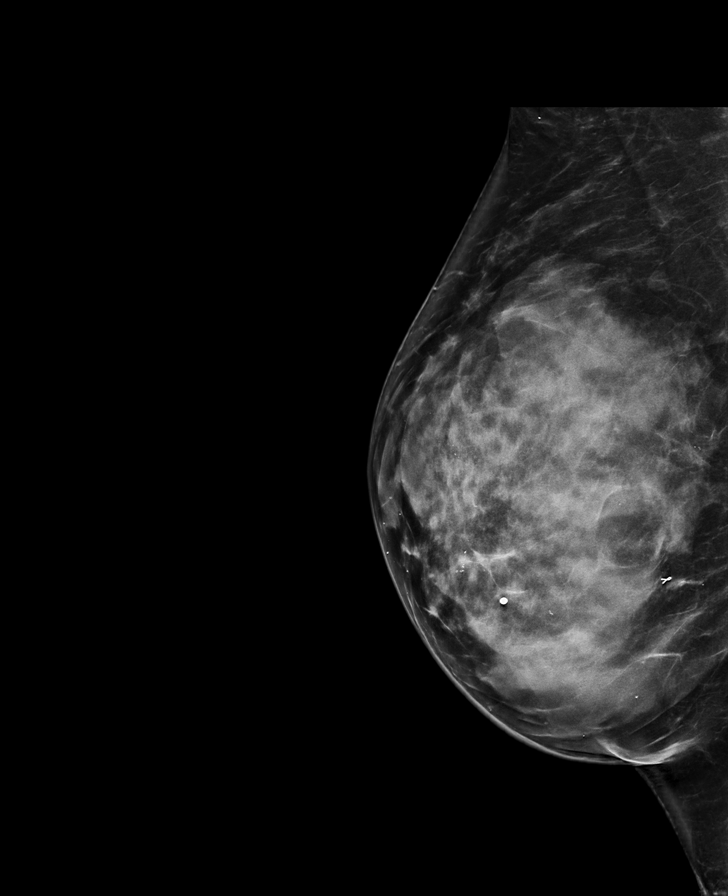

[L CC synth-2D]
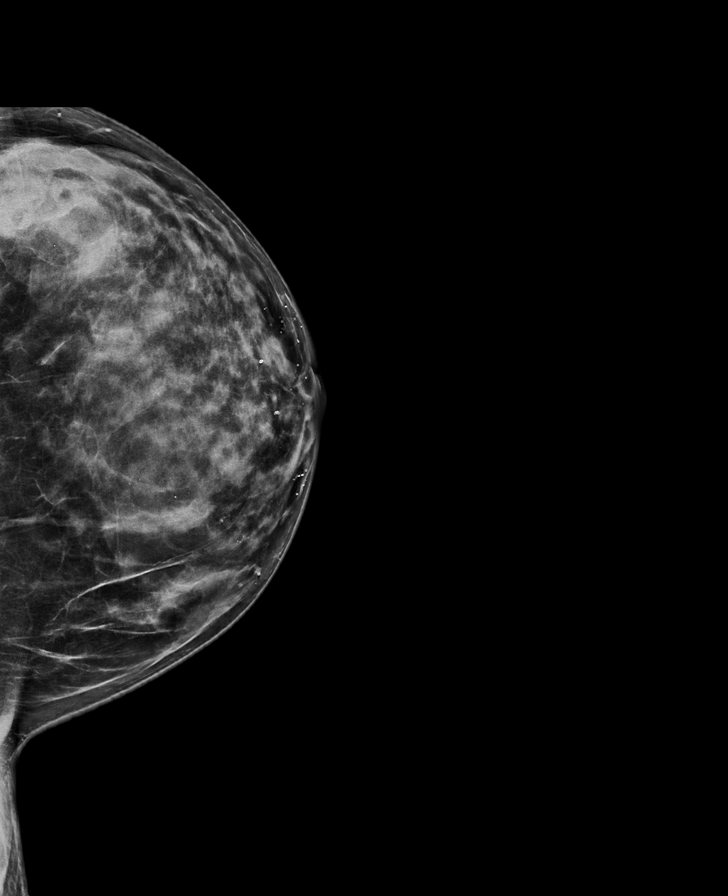

[L MLO synth-2D]
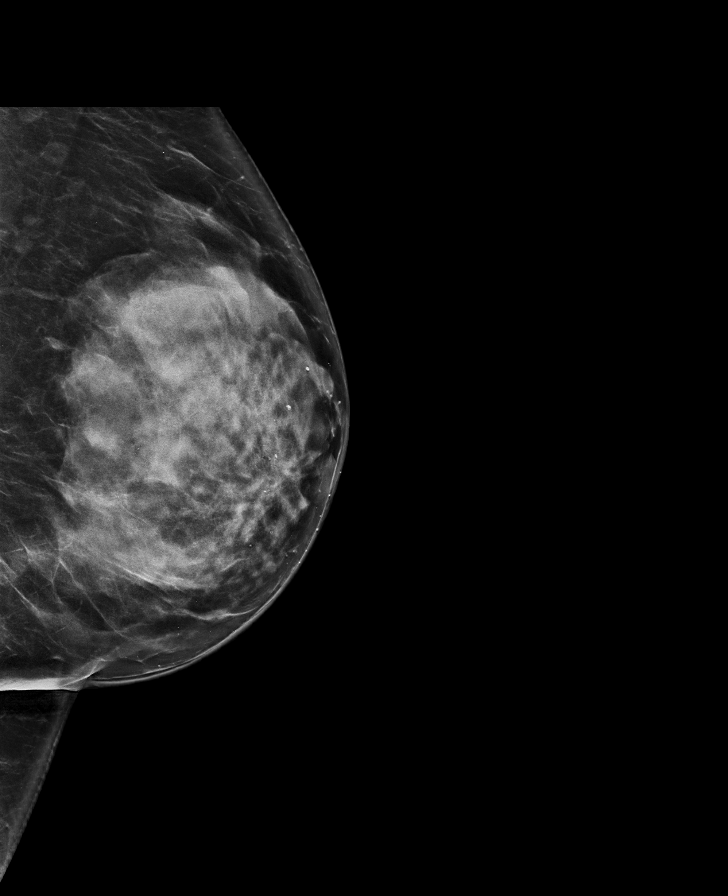

[R TAN synth-2D]
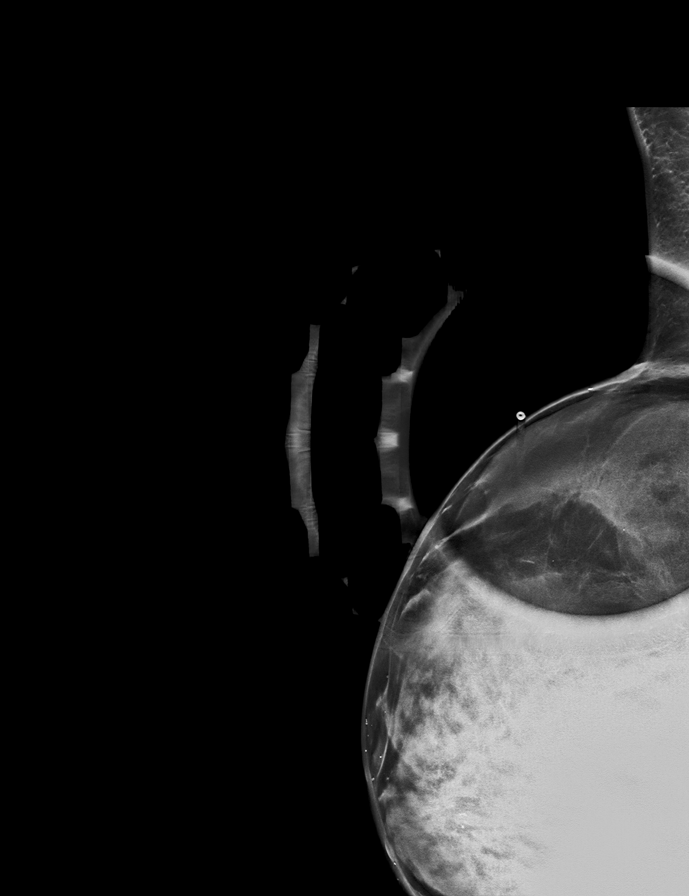

[R XCCL synth-2D]
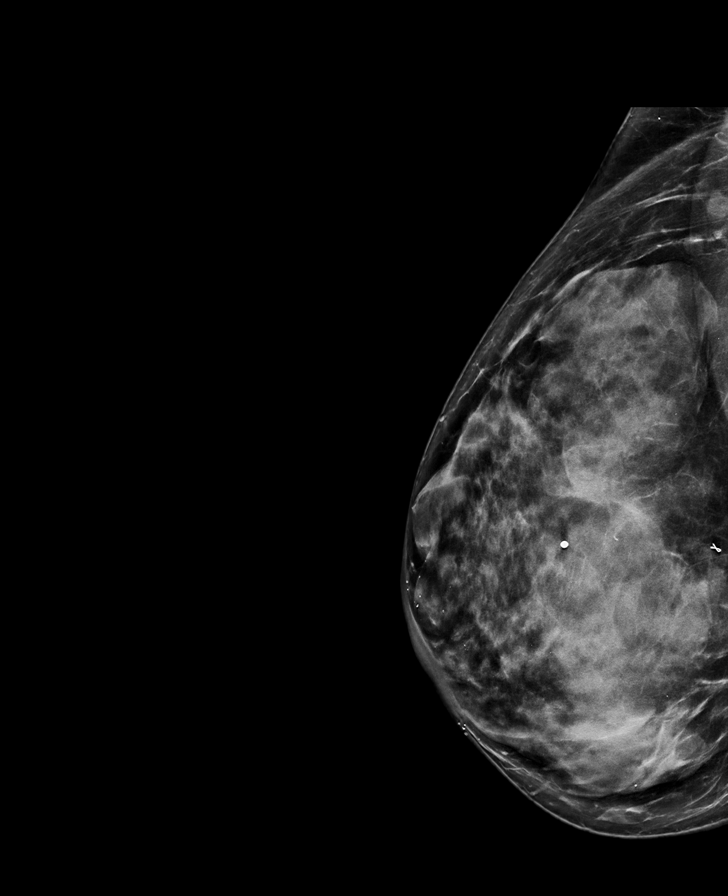

[R CC synth-2D]
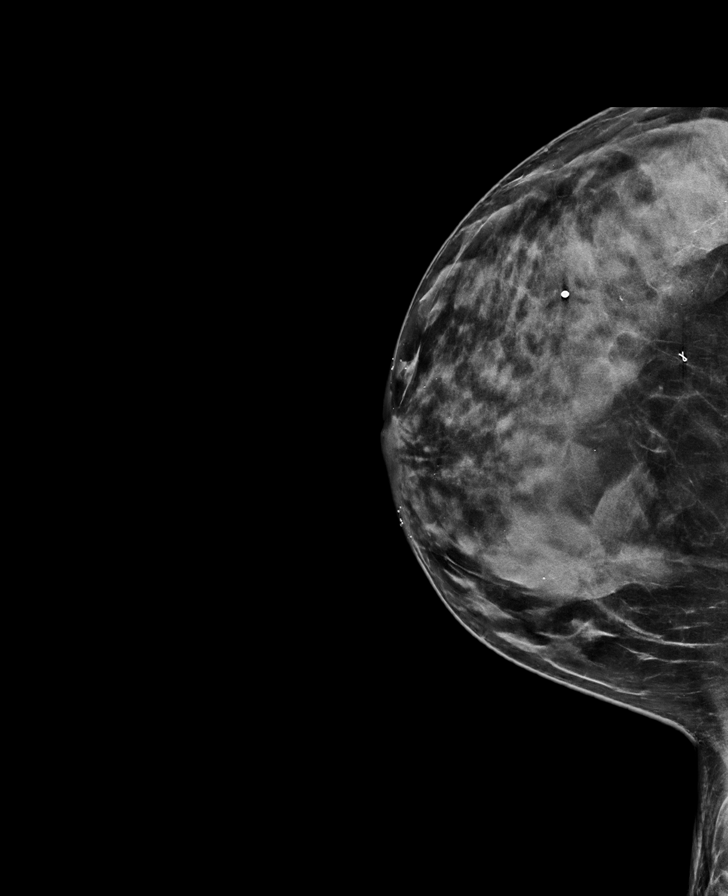

[8 of 38 positions shown; findings below may reference images not displayed]

ACR Breast Density Category d: The breast tissue is extremely dense,
which lowers the sensitivity of mammography.
FINDINGS: Stable mammographic appearance of the breasts, including the
previously biopsied benign calcifications in the inferior right
breast posteriorly. No interval findings suspicious for malignancy
in either breast.

Mammographic images were processed with CAD.

On physical exam, the patient is tender to palpation in the 9
o'clock position of the right breast, 10 cm from the nipple, at the
location of focal pain, without palpable mass. No mass is palpable
from that location to the nipple.

Targeted ultrasound is performed, showing normal appearing dense
glandular tissue throughout the outer right breast in the areas of
intermittent pain.
IMPRESSION: No evidence of malignancy. The previously biopsied benign right
breast calcifications do not need further follow-up.

RECOMMENDATION:
Bilateral screening mammogram in 1 year.

I have discussed the findings and recommendations with the patient.
Results were also provided in writing at the conclusion of the
visit. If applicable, a reminder letter will be sent to the patient
regarding the next appointment.

BI-RADS CATEGORY  2: Benign.

## 2019-04-21 ENCOUNTER — Encounter: Payer: Self-pay | Admitting: Family Medicine

## 2019-04-29 ENCOUNTER — Other Ambulatory Visit: Payer: Self-pay

## 2019-04-29 ENCOUNTER — Other Ambulatory Visit: Payer: Self-pay | Admitting: Family Medicine

## 2019-04-29 ENCOUNTER — Encounter: Payer: Self-pay | Admitting: Family Medicine

## 2019-04-29 ENCOUNTER — Ambulatory Visit (INDEPENDENT_AMBULATORY_CARE_PROVIDER_SITE_OTHER): Payer: BC Managed Care – PPO | Admitting: Family Medicine

## 2019-04-29 VITALS — BP 122/84 | HR 97 | Temp 97.9°F | Resp 14 | Ht 66.0 in | Wt 157.9 lb

## 2019-04-29 DIAGNOSIS — N951 Menopausal and female climacteric states: Secondary | ICD-10-CM

## 2019-04-29 DIAGNOSIS — E538 Deficiency of other specified B group vitamins: Secondary | ICD-10-CM

## 2019-04-29 DIAGNOSIS — N941 Unspecified dyspareunia: Secondary | ICD-10-CM

## 2019-04-29 DIAGNOSIS — Z01419 Encounter for gynecological examination (general) (routine) without abnormal findings: Secondary | ICD-10-CM

## 2019-04-29 DIAGNOSIS — Z111 Encounter for screening for respiratory tuberculosis: Secondary | ICD-10-CM | POA: Diagnosis not present

## 2019-04-29 DIAGNOSIS — Z0000 Encounter for general adult medical examination without abnormal findings: Secondary | ICD-10-CM | POA: Diagnosis not present

## 2019-04-29 DIAGNOSIS — Z1211 Encounter for screening for malignant neoplasm of colon: Secondary | ICD-10-CM

## 2019-04-29 DIAGNOSIS — E559 Vitamin D deficiency, unspecified: Secondary | ICD-10-CM

## 2019-04-29 MED ORDER — ESTROGENS, CONJUGATED 0.625 MG/GM VA CREA: 1.0000 | TOPICAL_CREAM | Freq: Every day | VAGINAL | 12 refills | Status: DC

## 2019-04-29 NOTE — Progress Notes (Signed)
Patient: Erica Weber, Female    DOB: Aug 24, 1970, 49 y.o.   MRN: [id number] Arnetha Courser, MD Visit Date: 04/29/2019  Today's Provider: Delsa Grana, PA-C   Chief Complaint  Patient presents with  . Annual Exam    see's gyn   Subjective:   Annual physical exam:  Erica Weber is a 49 y.o. female who presents today for complete physical exam:  Exercise/Activity:  Exercise bike 5x a week  Diet/nutrition:  Generally healthy, mostly plant based diet and rarely eats meat  Vit B and D have been good with supplements  Sleep:  Not sleeping well right now, joint pain, thinks its all hormonal changes, some hot flashes  Hx of constipation:   From OBGYN: "Has almost daily bloating, not progressing. Hx of constipation. Bloating temporarily improved with BM. Is not taking probiotics. Hasn't tried increased fiber" She has been taking a probiotic and drinking more water.    Sees allergist - does shots  Perimenopausal - Black cohosh Saw GYN 02/19/2019 for well woman  USPSTF grade A and B recommendations - reviewed and addressed today  Depression:  Phq 9 completed today by patient, was reviewed by me with patient in the room, score is  negative, pt feels  PHQ 2/9 Scores 04/29/2019 05/03/2018 04/27/2017 04/06/2016  PHQ - 2 Score 0 0 0 0  PHQ- 9 Score 0 0 - -   Depression screen [facility] 2/9 04/29/2019 05/03/2018 04/27/2017 04/06/2016 11/23/2014  Decreased Interest 0 0 0 0 0  Down, Depressed, Hopeless 0 0 0 0 0  PHQ - 2 Score 0 0 0 0 0  Altered sleeping 0 0 - - -  Tired, decreased energy 0 0 - - -  Change in appetite 0 0 - - -  Feeling bad or failure about yourself  0 0 - - -  Trouble concentrating 0 0 - - -  Moving slowly or fidgety/restless 0 0 - - -  Suicidal thoughts 0 0 - - -  PHQ-9 Score 0 0 - - -  Difficult doing work/chores Not difficult at all Not difficult at all - - -    Alcohol screening:   Office Visit from 04/29/2019 in [facility]  AUDIT-C Score  3        Immunizations and Health Maintenance: Health Maintenance  Topic Date Due  . MAMMOGRAM  01/26/2019  . PAP SMEAR-Modifier  02/15/2021  . TETANUS/TDAP  04/28/2027  . INFLUENZA VACCINE  Completed  . HIV Screening  Completed     Hep C Screening: n/a  STD testing and prevention (HIV/chl/gon/syphilis): none  Intimate partner violence:safe, denies  Sexual History/Pain during Intercourse:  Some dryness  Married  Menstrual History/LMP/Abnormal Bleeding:  No LMP recorded. (Menstrual status: Perimenopausal).  Incontinence Symptoms: none  Breast cancer:  Last Mammogram: due  BRCA gene screening: none  Cervical cancer screening:  Family hx of cancers - breast, ovarian, uterine, colon:   Dads mom - paternal grandma and her brother, great uncle had breast CA, none in her mom aunts or uncles, no other CA hx in family   Osteoporosis:   Discussed high calcium and vitamin D supplementation, weight bearing exercises Pt supplementing with daily calcium/Vit D.  Skin cancer:  Hx of skin CA -  NO Discussed atypical lesions   Colorectal cancer:   colonoscopy is due this next year   Lung cancer:   Low Dose CT Chest recommended if Age 43-80 years, 30 pack-year currently smoking OR have  quit w/in 15years. Patient does not qualify.   Social History   Tobacco Use  . Smoking status: Never Smoker  . Smokeless tobacco: Never Used  Substance Use Topics  . Alcohol use: Yes    Alcohol/week: 3.0 - 4.0 standard drinks    Types: 3 - 4 Standard drinks or equivalent per week     ECG:  None today none indicated   Blood pressure/Hypertension: BP Readings from Last 3 Encounters:  04/29/19 122/84  02/19/19 110/80  05/03/18 116/74    Weight/Obesity: Wt Readings from Last 3 Encounters:  04/29/19 157 lb 14.4 oz (71.6 kg)  02/19/19 159 lb (72.1 kg)  05/03/18 153 lb 12.8 oz (69.8 kg)   BMI Readings from Last 3 Encounters:  04/29/19 25.49 kg/m  02/19/19 25.66 kg/m  05/03/18 24.82 kg/m      Lipids:  Lab Results  Component Value Date   CHOL 145 05/03/2018   CHOL 146 04/27/2017   CHOL 150 04/06/2016   Lab Results  Component Value Date   HDL 50 (L) 05/03/2018   HDL 53 04/27/2017   HDL 51 04/06/2016   Lab Results  Component Value Date   LDLCALC 77 05/03/2018   LDLCALC 72 04/27/2017   LDLCALC 74 04/06/2016   Lab Results  Component Value Date   TRIG 96 05/03/2018   TRIG 129 04/27/2017   TRIG 123 04/06/2016   Lab Results  Component Value Date   CHOLHDL 2.9 05/03/2018   CHOLHDL 2.8 04/27/2017   CHOLHDL 2.9 04/06/2016   No results found for: LDLDIRECT Based on the results of lipid panel his/her cardiovascular risk factor ( using [facility] )  in the next 10 years is: The 10-year ASCVD risk score Mikey Bussing DC Brooke Bonito., et al., 2013) is: 0.7%   Values used to calculate the score:     Age: 60 years     Sex: Female     Is Non-Hispanic African American: No     Diabetic: No     Tobacco smoker: No     Systolic Blood Pressure: 161 mmHg     Is BP treated: No     HDL Cholesterol: 50 mg/dL     Total Cholesterol: 145 mg/dL  Glucose:  Glucose, Bld  Date Value Ref Range Status  05/03/2018 76 65 - 99 mg/dL Final    Comment:    .            Fasting reference interval .   04/27/2017 84 65 - 99 mg/dL Final    Comment:    .            Fasting reference interval .   04/06/2016 80 65 - 99 mg/dL Final      Office Visit from 04/29/2019 in [facility]  AUDIT-C Score  3     Depression: Phq 9 is  negative Depression screen [facility] 2/9 04/29/2019 05/03/2018 04/27/2017 04/06/2016 11/23/2014  Decreased Interest 0 0 0 0 0  Down, Depressed, Hopeless 0 0 0 0 0  PHQ - 2 Score 0 0 0 0 0  Altered sleeping 0 0 - - -  Tired, decreased energy 0 0 - - -  Change in appetite 0 0 - - -  Feeling bad or failure about yourself  0 0 - - -  Trouble concentrating 0 0 - - -  Moving slowly or fidgety/restless 0 0 - - -  Suicidal thoughts 0 0 - - -  PHQ-9 Score 0 0 - - -  Difficult doing work/chores Not difficult at all Not difficult at all - - -   Hypertension: BP Readings from Last 3 Encounters:  04/29/19 122/84  02/19/19 110/80  05/03/18 116/74   Obesity: Wt Readings from Last 3 Encounters:  04/29/19 157 lb 14.4 oz (71.6 kg)  02/19/19 159 lb (72.1 kg)  05/03/18 153 lb 12.8 oz (69.8 kg)   BMI Readings from Last 3 Encounters:  04/29/19 25.49 kg/m  02/19/19 25.66 kg/m  05/03/18 24.82 kg/m      Advanced Care Planning:  A voluntary discussion about advance care planning including the explanation and discussion of advance directives.   Discussed health care proxy and Living will, and the patient was able to identify a health care proxy as [place] Patient does have a living will at present time.   Social History      She  reports that she has never smoked. She has never used smokeless tobacco. She reports current alcohol use of about 3.0 - 4.0 standard drinks of alcohol per week. She reports that she does not use drugs.       Social History   Socioeconomic History  . Marital status: Married    Spouse name: sanuel  . Number of children: 1  . Years of education: 79  . Highest education level: Master's degree (e.g., MA, MS, MEng, MEd, MSW, MBA)  Occupational History  . Not on file  Tobacco Use  . Smoking status: Never Smoker  . Smokeless tobacco: Never Used  Substance and Sexual Activity  . Alcohol use: Yes    Alcohol/week: 3.0 - 4.0 standard drinks    Types: 3 - 4 Standard drinks or equivalent per week  . Drug use: No  . Sexual activity: Yes    Partners: Male    Birth control/protection: None  Other Topics Concern  . Not on file  Social History Narrative  . Not on file   Social Determinants of Health   Financial Resource Strain: Low Risk   . Difficulty of Paying Living Expenses: Not hard at all  Food Insecurity: No Food Insecurity  . Worried About Charity fundraiser in the Last Year: Never true  . Ran Out of Food in  the Last Year: Never true  Transportation Needs: No Transportation Needs  . Lack of Transportation (Medical): No  . Lack of Transportation (Non-Medical): No  Physical Activity: Inactive  . Days of Exercise per Week: 0 days  . Minutes of Exercise per Session: 0 min  Stress: No Stress Concern Present  . Feeling of Stress : Not at all  Social Connections: Not Isolated  . Frequency of Communication with Friends and Family: Twice a week  . Frequency of Social Gatherings with Friends and Family: Twice a week  . Attends Religious Services: More than 4 times per year  . Active Member of Clubs or Organizations: Yes  . Attends Archivist Meetings: More than 4 times per year  . Marital Status: Married    Family History        Family Status  Relation Name Status  . Mother  Deceased  . Father  Deceased  . Son  Alive  . MGM  Deceased  . PGM  Deceased  . Other pat grt UNCLE Alive  . Mat Aunt 1/2 sibling Alive        Her family history includes Breast cancer in her maternal grandmother, paternal grandmother, and another family member; Cancer in her maternal aunt and mother; Diabetes  in her mother; Down syndrome in her son; Hyperlipidemia in her father; Stroke in her father.       Family History  Problem Relation Age of Onset  . Cancer Mother        lung  . Diabetes Mother   . Stroke Father   . Hyperlipidemia Father   . Down syndrome Son   . Breast cancer Maternal Grandmother        81s  . Breast cancer Paternal Grandmother        69s  . Breast cancer Other   . Cancer Maternal Aunt        ovar vs uterine    Patient Active Problem List   Diagnosis Date Noted  . Slow transit constipation 02/19/2019  . Bloating 02/19/2019  . Family history of breast cancer 02/19/2019  . Dysplastic nevus 04/27/2017  . Vitamin D deficiency 04/07/2016  . Vitamin B12 deficiency 04/07/2016  . Fatigue 04/06/2016  . Preventative health care 04/06/2016  . Asymptomatic PVCs 11/23/2014  .  Allergic rhinitis with postnasal drip 11/23/2014  . Eczema of left external ear 11/23/2014  . Screening cholesterol level 11/23/2014  . Screening for tuberculosis 11/23/2014    Past Surgical History:  Procedure Laterality Date  . BREAST BIOPSY Right 03/06/2016   benign  . BREAST SURGERY Bilateral 2000   Breast Reduction  . CHOLECYSTECTOMY  1999  . LIPOSUCTION  2012   Fat deposit in back  . REDUCTION MAMMAPLASTY Bilateral    1999     Current Outpatient Medications:  .  fluconazole (DIFLUCAN) 200 MG tablet, Take 1 tablet by mouth daily., Disp: , Rfl:  .  fluticasone (FLONASE) 50 MCG/ACT nasal spray, Place 1 spray into both nostrils daily., Disp: , Rfl:  .  levocetirizine (XYZAL) 5 MG tablet, Take 5 mg by mouth at bedtime., Disp: , Rfl:  .  Multiple Vitamin (MULTIVITAMIN) tablet, Take 1 tablet by mouth daily., Disp: , Rfl:   No Known Allergies  Patient Care Team: Lada, Satira Anis, MD as PCP - General (Family Medicine)  Review of Systems  Constitutional: Negative.  Negative for activity change, appetite change, fatigue and unexpected weight change.  HENT: Negative.   Eyes: Negative.   Respiratory: Negative.  Negative for shortness of breath.   Cardiovascular: Negative.  Negative for chest pain, palpitations and leg swelling.  Gastrointestinal: Negative.  Negative for abdominal pain and blood in stool.  Endocrine: Negative.   Genitourinary: Negative.   Musculoskeletal: Negative.  Negative for arthralgias, gait problem, joint swelling and myalgias.  Skin: Negative.  Negative for color change, pallor and rash.  Allergic/Immunologic: Negative.   Neurological: Negative.  Negative for syncope and weakness.  Hematological: Negative.   Psychiatric/Behavioral: Negative.  Negative for confusion, dysphoric mood, self-injury and suicidal ideas. The patient is not nervous/anxious.   All other systems reviewed and are negative.    I personally reviewed active problem list, medication  list, allergies, family history, social history, health maintenance, notes from last encounter, lab results, imaging with the patient/caregiver today. Reviewed last labs, last OV       Objective:   Vitals:  Vitals:   04/29/19 0851  BP: 122/84  Pulse: 97  Resp: 14  Temp: 97.9 F (36.6 C)  SpO2: 98%  Weight: 157 lb 14.4 oz (71.6 kg)  Height: 5' 6"  (1.676 m)    Body mass index is 25.49 kg/m.  Physical Exam Vitals and nursing note reviewed.  Constitutional:      General: She is  not in acute distress.    Appearance: Normal appearance. She is well-developed. She is not ill-appearing, toxic-appearing or diaphoretic.     Interventions: Face mask in place.  HENT:     Head: Normocephalic and atraumatic.     Right Ear: External ear normal.     Left Ear: External ear normal.  Eyes:     General: Lids are normal. No scleral icterus.       Right eye: No discharge.        Left eye: No discharge.     Conjunctiva/sclera: Conjunctivae normal.  Neck:     Trachea: Phonation normal. No tracheal deviation.  Cardiovascular:     Rate and Rhythm: Normal rate and regular rhythm.     Pulses: Normal pulses.          Radial pulses are 2+ on the right side and 2+ on the left side.       Posterior tibial pulses are 2+ on the right side and 2+ on the left side.     Heart sounds: Normal heart sounds. No murmur. No friction rub. No gallop.   Pulmonary:     Effort: Pulmonary effort is normal. No respiratory distress.     Breath sounds: Normal breath sounds. No stridor. No wheezing, rhonchi or rales.  Chest:     Chest wall: No tenderness.  Abdominal:     General: Bowel sounds are normal. There is no distension.     Palpations: Abdomen is soft.     Tenderness: There is no abdominal tenderness. There is no guarding or rebound.  Musculoskeletal:        General: No deformity. Normal range of motion.     Cervical back: Normal range of motion and neck supple.     Right lower leg: No edema.     Left  lower leg: No edema.  Lymphadenopathy:     Cervical: No cervical adenopathy.  Skin:    General: Skin is warm and dry.     Capillary Refill: Capillary refill takes less than 2 seconds.     Coloration: Skin is not jaundiced or pale.     Findings: No rash.  Neurological:     Mental Status: She is alert and oriented to person, place, and time.     Motor: No abnormal muscle tone.     Gait: Gait normal.  Psychiatric:        Speech: Speech normal.        Behavior: Behavior normal.       Fall Risk: Fall Risk  04/29/2019 05/03/2018 04/27/2017 04/06/2016 11/23/2014  Falls in the past year? 0 1 Yes No No  Number falls in past yr: 0 0 1 - -  Injury with Fall? 0 0 No - -    Functional Status Survey: Is the patient deaf or have difficulty hearing?: No Does the patient have difficulty seeing, even when wearing glasses/contacts?: No Does the patient have difficulty concentrating, remembering, or making decisions?: No Does the patient have difficulty walking or climbing stairs?: No Does the patient have difficulty dressing or bathing?: No Does the patient have difficulty doing errands alone such as visiting a doctor's office or shopping?: No   Assessment & Plan:    CPE completed today  . USPSTF grade A and B recommendations reviewed with patient; age-appropriate recommendations, preventive care, screening tests, etc discussed and encouraged; healthy living encouraged; see AVS for patient education given to patient  . Discussed importance of 150 minutes of physical activity  weekly, AHA exercise recommendations given to pt in AVS/handout  . Discussed importance of healthy diet:  eating lean meats and proteins, avoiding trans fats and saturated fats, avoid simple sugars and excessive carbs in diet, eat 6 servings of fruit/vegetables daily and drink plenty of water and avoid sweet beverages.    . Recommended pt to do annual eye exam and routine dental exams/cleanings  . Depression, alcohol,  fall screening completed as documented above and per flowsheets  . Reviewed Health Maintenance: Health Maintenance  Topic Date Due  . MAMMOGRAM  01/26/2019  . PAP SMEAR-Modifier  02/15/2021  . TETANUS/TDAP  04/28/2027  . INFLUENZA VACCINE  Completed  . HIV Screening  Completed    . Immunizations: Immunization History  Administered Date(s) Administered  . Hepb-cpg 05/03/2018, 06/21/2018  . Influenza Inj Mdck Quad Pf 01/06/2018  . Influenza,inj,Quad PF,6+ Mos 04/06/2016, 04/27/2017, 01/23/2019  . PPD Test 11/23/2014  . Tdap 04/27/2017      Orders Placed This Encounter  Procedures  . QuantiFERON-TB Gold Plus  . CBC w/ Diff  . Lipid Panel    Order Specific Question:   Has the patient fasted?    Answer:   Yes  . CMP w GFR  . Vitamin B12  . Vit D  . TSH  . Ambulatory referral to Gastroenterology    Referral Priority:   Routine    Referral Type:   Consultation    Referral Reason:   Specialty Services Required    Number of Visits Requested:   1      ICD-10-CM   1. Adult general medical exam  Z00.00 CBC w/ Diff    Lipid Panel    CMP w GFR    TSH  2. Vitamin B12 deficiency  E53.8 Vitamin B12  3. Vitamin D deficiency  E55.9 Vit D  4. Screening-pulmonary TB  Z11.1 QuantiFERON-TB Gold Plus  5. Well woman exam  Z01.419 CBC w/ Diff    Lipid Panel    CMP w GFR    TSH  6. Perimenopausal  N95.1 conjugated estrogens (PREMARIN) vaginal cream  7. Dyspareunia, female  N94.10 conjugated estrogens (PREMARIN) vaginal cream  8. Screening for malignant neoplasm of colon  Z12.11 Ambulatory referral to Gastroenterology       Delsa Grana, PA-C 04/29/19 8:59 AM  [facility]

## 2019-04-29 NOTE — Patient Instructions (Signed)

## 2019-05-05 ENCOUNTER — Encounter: Payer: BC Managed Care – PPO | Admitting: Family Medicine

## 2019-05-05 ENCOUNTER — Encounter: Payer: Self-pay | Admitting: *Deleted

## 2019-05-09 ENCOUNTER — Encounter: Payer: BLUE CROSS/BLUE SHIELD | Admitting: Family Medicine

## 2019-07-01 LAB — TSH: TSH: 1.9 mIU/L

## 2019-07-03 LAB — CBC WITH DIFFERENTIAL/PLATELET
Absolute Monocytes: 490 cells/uL (ref 200–950)
Basophils Absolute: 39 cells/uL (ref 0–200)
Basophils Relative: 0.7 %
Eosinophils Absolute: 99 cells/uL (ref 15–500)
Eosinophils Relative: 1.8 %
HCT: 41.8 % (ref 35.0–45.0)
Hemoglobin: 14 g/dL (ref 11.7–15.5)
Lymphs Abs: 1348 cells/uL (ref 850–3900)
MCH: 30.1 pg (ref 27.0–33.0)
MCHC: 33.5 g/dL (ref 32.0–36.0)
MCV: 89.9 fL (ref 80.0–100.0)
MPV: 9.8 fL (ref 7.5–12.5)
Monocytes Relative: 8.9 %
Neutro Abs: 3526 cells/uL (ref 1500–7800)
Neutrophils Relative %: 64.1 %
Platelets: 223 10*3/uL (ref 140–400)
RBC: 4.65 10*6/uL (ref 3.80–5.10)
RDW: 12.6 % (ref 11.0–15.0)
Total Lymphocyte: 24.5 %
WBC: 5.5 10*3/uL (ref 3.8–10.8)

## 2019-07-03 LAB — COMPLETE METABOLIC PANEL WITH GFR
AG Ratio: 2 (calc) (ref 1.0–2.5)
ALT: 43 U/L — ABNORMAL HIGH (ref 6–29)
AST: 36 U/L — ABNORMAL HIGH (ref 10–35)
Albumin: 4.4 g/dL (ref 3.6–5.1)
Alkaline phosphatase (APISO): 59 U/L (ref 37–153)
BUN: 10 mg/dL (ref 7–25)
CO2: 28 mmol/L (ref 20–32)
Calcium: 9.4 mg/dL (ref 8.6–10.4)
Chloride: 101 mmol/L (ref 98–110)
Creat: 0.66 mg/dL (ref 0.50–1.05)
GFR, Est African American: 119 mL/min/{1.73_m2} (ref >=60)
GFR, Est Non African American: 103 mL/min/{1.73_m2} (ref >=60)
Globulin: 2.2 g/dL (calc) (ref 1.9–3.7)
Glucose, Bld: 89 mg/dL (ref 65–99)
Potassium: 4.3 mmol/L (ref 3.5–5.3)
Sodium: 138 mmol/L (ref 135–146)
Total Bilirubin: 0.7 mg/dL (ref 0.2–1.2)
Total Protein: 6.6 g/dL (ref 6.1–8.1)

## 2019-07-03 LAB — QUANTIFERON-TB GOLD PLUS
Mitogen-NIL: >10 IU/mL
NIL: 0.03 IU/mL
QuantiFERON-TB Gold Plus: NEGATIVE
TB1-NIL: 0 IU/mL
TB2-NIL: 0.02 IU/mL

## 2019-07-03 LAB — ADVANCED WRITTEN NOTIFICATION (AWN) TEST REFUSAL

## 2019-07-03 LAB — VITAMIN D 25 HYDROXY (VIT D DEFICIENCY, FRACTURES): Vit D, 25-Hydroxy: 21 ng/mL — ABNORMAL LOW (ref 30–100)

## 2019-07-03 LAB — VITAMIN B12: Vitamin B-12: 415 pg/mL (ref 200–1100)

## 2019-07-03 LAB — LIPID PANEL
Cholesterol: 197 mg/dL (ref <200)
HDL: 53 mg/dL (ref >=50)
LDL Cholesterol (Calc): 117 mg/dL (calc) — ABNORMAL HIGH
Non-HDL Cholesterol (Calc): 144 mg/dL (calc) — ABNORMAL HIGH (ref <130)
Total CHOL/HDL Ratio: 3.7 (calc) (ref <5.0)
Triglycerides: 159 mg/dL — ABNORMAL HIGH (ref <150)

## 2019-10-23 ENCOUNTER — Ambulatory Visit: Payer: Self-pay | Admitting: Family Medicine

## 2019-11-18 ENCOUNTER — Other Ambulatory Visit: Payer: Self-pay

## 2019-11-18 ENCOUNTER — Ambulatory Visit
Admission: RE | Admit: 2019-11-18 | Discharge: 2019-11-18 | Disposition: A | Discharge status: Home / Self Care | Payer: BC Managed Care – PPO | Source: Ambulatory Visit | Attending: Emergency Medicine | Admitting: Emergency Medicine

## 2019-11-18 VITALS — BP 130/77 | HR 95 | Temp 99.0°F | Resp 16

## 2019-11-18 DIAGNOSIS — J069 Acute upper respiratory infection, unspecified: Secondary | ICD-10-CM

## 2019-11-18 NOTE — ED Provider Notes (Signed)
Roderic Palau    CSN: [id number] Arrival date & time: 11/18/19  1056      History   Chief Complaint Chief Complaint  Patient presents with  . Cough  . Nasal Congestion    HPI Erica Weber is a 49 y.o. female.   Presents with 3-day history of nonproductive cough, nasal congestion, sinus headache.  She recently traveled and requests a COVID test.  She denies fever, ear pain, sore throat, shortness of breath, vomiting, diarrhea, rash, or other symptoms.  Treatment attempted at home with Advil.  Patient has received her COVID vaccines.   The history is provided by the patient.    Past Medical History:  Diagnosis Date  . Allergy   . Asthma   . Dysplastic nevus 04/27/2017   Right leg, left shoulder, left thigh, sees dermatologist, Dr. Ree Edman  . Heart murmur     Patient Active Problem List   Diagnosis Date Noted  . Slow transit constipation 02/19/2019  . Bloating 02/19/2019  . Family history of breast cancer 02/19/2019  . Dysplastic nevus 04/27/2017  . Vitamin D deficiency 04/07/2016  . Vitamin B12 deficiency 04/07/2016  . Fatigue 04/06/2016  . Preventative health care 04/06/2016  . Asymptomatic PVCs 11/23/2014  . Allergic rhinitis with postnasal drip 11/23/2014  . Eczema of left external ear 11/23/2014  . Screening cholesterol level 11/23/2014  . Screening for tuberculosis 11/23/2014    Past Surgical History:  Procedure Laterality Date  . BREAST BIOPSY Right 03/06/2016   benign  . BREAST SURGERY Bilateral 2000   Breast Reduction  . CHOLECYSTECTOMY  1999  . LIPOSUCTION  2012   Fat deposit in back  . REDUCTION MAMMAPLASTY Bilateral    1999    OB History    Gravida  3   Para  1   Term  1   Preterm      AB  2   Living        SAB  2   TAB      Ectopic      Multiple      Live Births  1            Home Medications    Prior to Admission medications   Medication Sig Start Date End Date Taking? Authorizing Provider    levocetirizine (XYZAL) 5 MG tablet Take 5 mg by mouth at bedtime. 04/10/19  Yes [provider]  conjugated estrogens (PREMARIN) vaginal cream Place 1 Applicatorful vaginally daily. 04/29/19   Delsa Grana, PA-C  fluconazole (DIFLUCAN) 200 MG tablet Take 1 tablet by mouth daily. 04/24/19   [provider]  fluticasone (FLONASE) 50 MCG/ACT nasal spray Place 1 spray into both nostrils daily.    [provider]  Multiple Vitamin (MULTIVITAMIN) tablet Take 1 tablet by mouth daily.    [provider]    Family History Family History  Problem Relation Age of Onset  . Cancer Mother        lung  . Diabetes Mother   . Stroke Father   . Hyperlipidemia Father   . Down syndrome Son   . Breast cancer Maternal Grandmother        76s  . Breast cancer Paternal Grandmother        18s  . Breast cancer Other   . Cancer Maternal Aunt        ovar vs uterine - lung ca 2020    Social History Social History   Tobacco Use  .  Smoking status: Never Smoker  . Smokeless tobacco: Never Used  Vaping Use  . Vaping Use: Never used  Substance Use Topics  . Alcohol use: Yes    Alcohol/week: 3.0 - 4.0 standard drinks    Types: 3 - 4 Standard drinks or equivalent per week  . Drug use: No     Allergies   Patient has no known allergies.   Review of Systems Review of Systems  Constitutional: Negative for chills and fever.  HENT: Positive for congestion and sinus pressure. Negative for ear pain and sore throat.   Eyes: Negative for pain and visual disturbance.  Respiratory: Positive for cough. Negative for shortness of breath.   Cardiovascular: Negative for chest pain and palpitations.  Gastrointestinal: Negative for abdominal pain, diarrhea, nausea and vomiting.  Genitourinary: Negative for dysuria and hematuria.  Musculoskeletal: Negative for arthralgias and back pain.  Skin: Negative for color change and rash.  Neurological: Negative for seizures and syncope.   All other systems reviewed and are negative.    Physical Exam Triage Vital Signs ED Triage Vitals  Enc Vitals Group     BP      Pulse      Resp      Temp      Temp src      SpO2      Weight      Height      Head Circumference      Peak Flow      Pain Score      Pain Loc      Pain Edu?      Excl. in [place]?    No data found.  Updated Vital Signs BP 130/77 (BP Location: Left Arm)   Pulse 95   Temp 99 F (37.2 C) (Oral)   Resp 16   SpO2 97%   Visual Acuity Right Eye Distance:   Left Eye Distance:   Bilateral Distance:    Right Eye Near:   Left Eye Near:    Bilateral Near:     Physical Exam Vitals and nursing note reviewed.  Constitutional:      General: She is not in acute distress.    Appearance: She is well-developed. She is not ill-appearing.  HENT:     Head: Normocephalic and atraumatic.     Right Ear: Tympanic membrane normal.     Left Ear: Tympanic membrane normal.     Nose: Congestion present.     Mouth/Throat:     Mouth: Mucous membranes are moist.     Pharynx: Oropharynx is clear.  Eyes:     Conjunctiva/sclera: Conjunctivae normal.  Cardiovascular:     Rate and Rhythm: Normal rate and regular rhythm.     Heart sounds: No murmur heard.   Pulmonary:     Effort: Pulmonary effort is normal. No respiratory distress.     Breath sounds: Normal breath sounds.  Abdominal:     Palpations: Abdomen is soft.     Tenderness: There is no abdominal tenderness. There is no guarding or rebound.  Musculoskeletal:     Cervical back: Neck supple.  Skin:    General: Skin is warm and dry.     Findings: No rash.  Neurological:     General: No focal deficit present.     Mental Status: She is alert and oriented to person, place, and time.     Gait: Gait normal.  Psychiatric:        Mood and Affect: Mood normal.  Behavior: Behavior normal.      UC Treatments / Results  Labs (all labs ordered are listed, but only abnormal results are displayed) Labs  Reviewed  NOVEL CORONAVIRUS, NAA    EKG   Radiology No results found.  Procedures Procedures (including critical care time)  Medications Ordered in UC Medications - No data to display  Initial Impression / Assessment and Plan / UC Course  I have reviewed the triage vital signs and the nursing notes.  Pertinent labs & imaging results that were available during my care of the patient were reviewed by me and considered in my medical decision making (see chart for details).   Viral upper respiratory infection.  PCR COVID pending.  Instructed patient to self quarantine until the test result is back.  Discussed symptomatic treatment.  Instructed her to follow-up with her PCP if her symptoms are not improving.  Patient agrees to plan of care.     Final Clinical Impressions(s) / UC Diagnoses   Final diagnoses:  Viral URI     Discharge Instructions     Your COVID test is pending.  You should self quarantine until the test result is back.    Take Tylenol as needed for fever or discomfort.  Rest and keep yourself hydrated.    Follow up with your primary care provider if your symptoms are not improving.          ED Prescriptions    None     PDMP not reviewed this encounter.   Sharion Balloon, NP 11/18/19 1130

## 2019-11-18 NOTE — ED Triage Notes (Signed)
Pt presents to UC for nasal congestion, headache, cough, x3 days. Pt states she traveled and is concerned for covid. Pt denies fevers, sore throat, n/v/d, loss of taste or smell. Pt has been treating with Advil with out relief. Pt requesting covid testing at this time.

## 2019-11-18 NOTE — Discharge Instructions (Signed)
Your COVID test is pending.  You should self quarantine until the test result is back.    Take Tylenol as needed for fever or discomfort.  Rest and keep yourself hydrated.    Follow-up with your primary care provider if your symptoms are not improving.     

## 2019-11-19 LAB — NOVEL CORONAVIRUS, NAA: SARS-CoV-2, NAA: NOT DETECTED

## 2019-11-19 LAB — SARS-COV-2, NAA 2 DAY TAT

## 2019-11-20 ENCOUNTER — Ambulatory Visit: Payer: Self-pay | Admitting: Family Medicine

## 2020-01-01 NOTE — Progress Notes (Signed)
Name: Erica Weber   MRN: [id number]    DOB: 1970/04/21   Date:01/02/2020       Progress Note  Chief Complaint  Patient presents with  . Follow-up  . Hyperlipidemia  . Medication Management    discuss premarin     Subjective:   Erica Weber is a 49 y.o. female, presents to clinic for routrine f/up   Hyperlipidemia: Currently treated with no current meds-she has worked on diet and lifestyle efforts, she would like to recheck her cholesterol today to see if it has improved Last Lipids: Lab Results  Component Value Date   CHOL 197 07/01/2019   HDL 53 07/01/2019   LDLCALC 117 (H) 07/01/2019   TRIG 159 (H) 07/01/2019   CHOLHDL 3.7 07/01/2019   - Denies: Chest pain, shortness of breath, myalgias, claudication  Last labs in March 2021 showed liver enzymes were very mildly elevated just had a normal range, no past LFT elevation      Current Outpatient Medications:  .  cholecalciferol (VITAMIN D3) 25 MCG (1000 UNIT) tablet, Take 1,000 Units by mouth daily., Disp: , Rfl:  .  fluticasone (FLONASE) 50 MCG/ACT nasal spray, Place 1 spray into both nostrils daily., Disp: , Rfl:  .  levocetirizine (XYZAL) 5 MG tablet, Take 5 mg by mouth at bedtime., Disp: , Rfl:  .  montelukast (SINGULAIR) 10 MG tablet, Take 10 mg by mouth at bedtime., Disp: , Rfl:  .  vitamin B-12 (CYANOCOBALAMIN) 100 MCG tablet, Take 100 mcg by mouth daily., Disp: , Rfl:  .  conjugated estrogens (PREMARIN) vaginal cream, Place 1 Applicatorful vaginally daily. (Patient not taking: Reported on 01/02/2020), Disp: 42.5 g, Rfl: 12  Patient Active Problem List   Diagnosis Date Noted  . Slow transit constipation 02/19/2019  . Bloating 02/19/2019  . Family history of breast cancer 02/19/2019  . Dysplastic nevus 04/27/2017  . Vitamin D deficiency 04/07/2016  . Vitamin B12 deficiency 04/07/2016  . Fatigue 04/06/2016  . Preventative health care 04/06/2016  . Asymptomatic PVCs 11/23/2014  . Allergic rhinitis with  postnasal drip 11/23/2014  . Eczema of left external ear 11/23/2014  . Screening cholesterol level 11/23/2014  . Screening for tuberculosis 11/23/2014    Past Surgical History:  Procedure Laterality Date  . BREAST BIOPSY Right 03/06/2016   benign  . BREAST SURGERY Bilateral 2000   Breast Reduction  . CHOLECYSTECTOMY  1999  . LIPOSUCTION  2012   Fat deposit in back  . REDUCTION MAMMAPLASTY Bilateral    1999    Family History  Problem Relation Age of Onset  . Cancer Mother        lung  . Diabetes Mother   . Stroke Father   . Hyperlipidemia Father   . Down syndrome Son   . Breast cancer Maternal Grandmother        63s  . Breast cancer Paternal Grandmother        65s  . Breast cancer Other   . Cancer Maternal Aunt        ovar vs uterine - lung ca 2020    Social History   Tobacco Use  . Smoking status: Never Smoker  . Smokeless tobacco: Never Used  Vaping Use  . Vaping Use: Never used  Substance Use Topics  . Alcohol use: Yes    Alcohol/week: 3.0 - 4.0 standard drinks    Types: 3 - 4 Standard drinks or equivalent per week  . Drug use: No  No Known Allergies  Health Maintenance  Topic Date Due  . Hepatitis C Screening  Never done  . MAMMOGRAM  01/26/2019  . COLONOSCOPY  Never done  . INFLUENZA VACCINE  11/16/2019  . PAP SMEAR-Modifier  02/15/2021  . TETANUS/TDAP  04/28/2027  . HIV Screening  Completed    Chart Review Today: I personally reviewed active problem list, medication list, allergies, family history, social history, health maintenance, notes from last encounter, lab results, imaging with the patient/caregiver today.   Review of Systems  10 Systems reviewed and are negative for acute change except as noted in the HPI.  Objective:   Vitals:   01/02/20 1443  BP: 116/68  Pulse: 86  Resp: 14  Temp: 97.9 F (36.6 C)  SpO2: 99%  Weight: 152 lb 11.2 oz (69.3 kg)  Height: 5\' 6"  (1.676 m)    Body mass index is 24.65 kg/m.  Physical  Exam Vitals and nursing note reviewed.  Constitutional:      General: She is not in acute distress.    Appearance: Normal appearance. She is well-developed. She is not ill-appearing, toxic-appearing or diaphoretic.     Interventions: Face mask in place.  HENT:     Head: Normocephalic and atraumatic.     Right Ear: External ear normal.     Left Ear: External ear normal.  Eyes:     General: Lids are normal. No scleral icterus.       Right eye: No discharge.        Left eye: No discharge.     Conjunctiva/sclera: Conjunctivae normal.  Neck:     Trachea: Phonation normal. No tracheal deviation.  Cardiovascular:     Rate and Rhythm: Normal rate and regular rhythm.     Pulses: Normal pulses.          Radial pulses are 2+ on the right side and 2+ on the left side.       Posterior tibial pulses are 2+ on the right side and 2+ on the left side.     Heart sounds: Normal heart sounds. No murmur heard.  No friction rub. No gallop.   Pulmonary:     Effort: Pulmonary effort is normal. No respiratory distress.     Breath sounds: Normal breath sounds. No stridor. No wheezing, rhonchi or rales.  Chest:     Chest wall: No tenderness.  Abdominal:     General: Bowel sounds are normal. There is no distension.     Palpations: Abdomen is soft.  Musculoskeletal:     Right lower leg: No edema.     Left lower leg: No edema.  Skin:    General: Skin is warm and dry.     Coloration: Skin is not jaundiced or pale.     Findings: No rash.  Neurological:     Mental Status: She is alert.     Motor: No abnormal muscle tone.     Gait: Gait normal.  Psychiatric:        Mood and Affect: Mood normal.        Speech: Speech normal.        Behavior: Behavior normal.         Assessment & Plan:     ICD-10-CM   1. Elevated cholesterol  E78.00 Lipid panel   Discussed elevated labs, diet and lifestyle efforts, reviewed her ASCVD risk score, currently not requiring statins  2. Elevated liver function tests   H85.27 COMPLETE METABOLIC PANEL WITH GFR  Recheck labs today, will initiate work-up if any worsening, suspect it may be secondary to diet, hyperlipidemia  3. Encounter for hepatitis C screening test for low risk patient  Z11.59 Hepatitis C Antibody  4. Screening for malignant neoplasm of colon  Z12.11 Ambulatory referral to Gastroenterology  5. Encounter for screening mammogram for malignant neoplasm of breast  Z12.31 MM 3D SCREEN BREAST BILATERAL  6. Dyspareunia, female  N94.10    Suspected atrophic vaginitis changes encouraged lubrication/hydration and offer trial of Premarin cream 2 times a week  7. Need for influenza vaccination  Z23 Flu Vaccine MDCK QUAD PF   Significant amount of time of visit today was spent discussing atrophic vaginitis and tx with moisturizers and vaginal estrogen creams, handout reviewing treatment, dosing symptoms and diagnosis were prepared for her and printed on her after visit summary including some of the following: Vaginal and urinary symptoms usually respond well to moisturizers and vaginal estrogen cream.  Usually treatment is started daily for 2 weeks followed by maintenance 2x a week thereafter - many variations of this depending how well it helps patients:  Vulvar and vaginal atrophy associated with menopause: Genitourinary syndrome of menopause (off-label dose): Intravaginal: Initial: 0.5 to 1 g once daily for 2 weeks, then reduce dose and/or frequency to lowest effective dose (usual maintenance dose: 0.5 to 1 g one to 3 times/week) (NAMS 2020).    Return in about 6 months (around 07/01/2020) for Routine follow-up, Annual Physical.   Delsa Grana, PA-C 01/02/20 3:16 PM

## 2020-01-02 ENCOUNTER — Ambulatory Visit: Payer: BC Managed Care – PPO | Admitting: Family Medicine

## 2020-01-02 ENCOUNTER — Other Ambulatory Visit: Payer: Self-pay | Admitting: Family Medicine

## 2020-01-02 ENCOUNTER — Encounter: Payer: Self-pay | Admitting: Family Medicine

## 2020-01-02 ENCOUNTER — Other Ambulatory Visit: Payer: Self-pay

## 2020-01-02 VITALS — BP 116/68 | HR 86 | Temp 97.9°F | Resp 14 | Ht 66.0 in | Wt 152.7 lb

## 2020-01-02 DIAGNOSIS — E7800 Pure hypercholesterolemia, unspecified: Secondary | ICD-10-CM | POA: Diagnosis not present

## 2020-01-02 DIAGNOSIS — N941 Unspecified dyspareunia: Secondary | ICD-10-CM

## 2020-01-02 DIAGNOSIS — R7989 Other specified abnormal findings of blood chemistry: Secondary | ICD-10-CM

## 2020-01-02 DIAGNOSIS — Z23 Encounter for immunization: Secondary | ICD-10-CM | POA: Diagnosis not present

## 2020-01-02 DIAGNOSIS — Z1211 Encounter for screening for malignant neoplasm of colon: Secondary | ICD-10-CM

## 2020-01-02 DIAGNOSIS — Z1159 Encounter for screening for other viral diseases: Secondary | ICD-10-CM | POA: Diagnosis not present

## 2020-01-02 DIAGNOSIS — Z1231 Encounter for screening mammogram for malignant neoplasm of breast: Secondary | ICD-10-CM

## 2020-01-02 MED ORDER — PREMARIN 0.625 MG/GM VA CREA
1.0000 | TOPICAL_CREAM | Freq: Every day | VAGINAL | 11 refills | Status: DC
Start: 2020-01-02 — End: 2020-01-02

## 2020-01-02 NOTE — Patient Instructions (Addendum)
Vaginal and urinary symptoms usually respond well to moisturizers and vaginal estrogen cream.  Usually treatment is started daily for 2 weeks followed by maintenance 2x a week thereafter - many variations of this depending how well it helps patients:  Vulvar and vaginal atrophy associated with menopause: Genitourinary syndrome of menopause (off-label dose): Intravaginal: Initial: 0.5 to 1 g once daily for 2 weeks, then reduce dose and/or frequency to lowest effective dose (usual maintenance dose: 0.5 to 1 g one to 3 times/week) (NAMS 2020).     Atrophic Vaginitis Atrophic vaginitis is a condition in which the tissues that line the vagina become dry and thin. This condition occurs in women who have stopped having their period. It is caused by a drop in a female hormone (estrogen). This hormone helps:  To keep the vagina moist.  To make a clear fluid. This clear fluid helps: ? To make the vagina ready for sex. ? To protect the vagina from infection. If the lining of the vagina is dry and thin, it may cause irritation, burning, or itchiness. It may also:  Make sex painful.  Make an exam of your vagina painful.  Cause bleeding.  Make you lose interest in sex.  Cause a burning feeling when you pee (urinate).  Cause a brown or yellow fluid to come from your vagina. Some women do not have symptoms. Follow these instructions at home: Medicines  Take over-the-counter and prescription medicines only as told by your doctor.  Do not use herbs or other medicines unless your doctor says it is okay.  Use medicines for for dryness. These include: ? Oils to make the vagina soft. ? Creams. ? Moisturizers. General instructions  Do not douche.  Do not use products that can make your vagina dry. These include: ? Scented sprays. ? Scented tampons. ? Scented soaps.  Sex can help increase blood flow and soften the tissue in the vagina. If it hurts to have sex: ? Tell your partner. ? Use  products to make sex more comfortable. Use these only as told by your doctor. Contact a doctor if you:  Have discharge from the vagina that is different than usual.  Have a bad smell coming from your vagina.  Have new symptoms.  Do not get better.  Get worse. Summary  Atrophic vaginitis is a condition in which the lining of the vagina becomes dry and thin.  This condition affects women who have stopped having their periods.  Treatment may include using products that help make the vagina soft.  Call a doctor if do not get better with treatment. This information is not intended to replace advice given to you by your health care provider. Make sure you discuss any questions you have with your health care provider. Document Revised: 04/16/2017 Document Reviewed: 04/16/2017 Elsevier Patient Education  2020 [employer].

## 2020-01-05 LAB — COMPLETE METABOLIC PANEL WITH GFR
AG Ratio: 2.1 (calc) (ref 1.0–2.5)
ALT: 10 U/L (ref 6–29)
AST: 17 U/L (ref 10–35)
Albumin: 4.7 g/dL (ref 3.6–5.1)
Alkaline phosphatase (APISO): 65 U/L (ref 37–153)
BUN: 9 mg/dL (ref 7–25)
CO2: 28 mmol/L (ref 20–32)
Calcium: 9.8 mg/dL (ref 8.6–10.4)
Chloride: 102 mmol/L (ref 98–110)
Creat: 0.67 mg/dL (ref 0.50–1.05)
GFR, Est African American: 119 mL/min/{1.73_m2} (ref >=60)
GFR, Est Non African American: 102 mL/min/{1.73_m2} (ref >=60)
Globulin: 2.2 g/dL (calc) (ref 1.9–3.7)
Glucose, Bld: 91 mg/dL (ref 65–99)
Potassium: 4.2 mmol/L (ref 3.5–5.3)
Sodium: 142 mmol/L (ref 135–146)
Total Bilirubin: 0.4 mg/dL (ref 0.2–1.2)
Total Protein: 6.9 g/dL (ref 6.1–8.1)

## 2020-01-05 LAB — LIPID PANEL
Cholesterol: 186 mg/dL (ref <200)
HDL: 50 mg/dL (ref >=50)
LDL Cholesterol (Calc): 109 mg/dL (calc) — ABNORMAL HIGH
Non-HDL Cholesterol (Calc): 136 mg/dL (calc) — ABNORMAL HIGH (ref <130)
Total CHOL/HDL Ratio: 3.7 (calc) (ref <5.0)
Triglycerides: 156 mg/dL — ABNORMAL HIGH (ref <150)

## 2020-01-05 LAB — HEPATITIS C ANTIBODY
Hepatitis C Ab: NONREACTIVE
SIGNAL TO CUT-OFF: 0 (ref <1.00)

## 2020-01-15 ENCOUNTER — Encounter: Payer: Self-pay | Admitting: Family Medicine

## 2020-02-06 ENCOUNTER — Ambulatory Visit: Payer: BC Managed Care – PPO

## 2020-02-11 ENCOUNTER — Telehealth (INDEPENDENT_AMBULATORY_CARE_PROVIDER_SITE_OTHER): Payer: Self-pay | Admitting: Gastroenterology

## 2020-02-11 ENCOUNTER — Other Ambulatory Visit: Payer: Self-pay

## 2020-02-11 DIAGNOSIS — Z1211 Encounter for screening for malignant neoplasm of colon: Secondary | ICD-10-CM

## 2020-02-11 MED ORDER — PEG 3350-KCL-NA BICARB-NACL 420 G PO SOLR
4000.0000 mL | Freq: Once | ORAL | 0 refills | Status: AC
Start: 2020-02-11 — End: 2020-02-11

## 2020-02-11 NOTE — Progress Notes (Signed)
Gastroenterology Pre-Procedure Review  Request Date: 03/10/20 Requesting Physician: Dr. Bonna Gains  PATIENT REVIEW QUESTIONS: The patient responded to the following health history questions as indicated:    1. Are you having any GI issues? no 2. Do you have a personal history of Polyps? no 3. Do you have a family history of Colon Cancer or Polyps? no 4. Diabetes Mellitus? no 5. Joint replacements in the past 12 months?no 6. Major health problems in the past 3 months?no 7. Any artificial heart valves, MVP, or defibrillator?no    MEDICATIONS & ALLERGIES:    Patient reports the following regarding taking any anticoagulation/antiplatelet therapy:   Plavix, Coumadin, Eliquis, Xarelto, Lovenox, Pradaxa, Brilinta, or Effient? no Aspirin? no  Patient confirms/reports the following medications:  Current Outpatient Medications  Medication Sig Dispense Refill  . cholecalciferol (VITAMIN D3) 25 MCG (1000 UNIT) tablet Take 1,000 Units by mouth daily.    . fluticasone (FLONASE) 50 MCG/ACT nasal spray Place 1 spray into both nostrils daily.    Marland Kitchen levocetirizine (XYZAL) 5 MG tablet Take 5 mg by mouth at bedtime.    . vitamin B-12 (CYANOCOBALAMIN) 100 MCG tablet Take 100 mcg by mouth daily.    Marland Kitchen estradiol (ESTRACE) 0.1 MG/GM vaginal cream Use one vaginal applicator QHS x 2 weeks and then 2x weekly (Patient not taking: Reported on 02/11/2020) 42.5 g 5  . montelukast (SINGULAIR) 10 MG tablet Take 10 mg by mouth at bedtime. (Patient not taking: Reported on 02/11/2020)     No current facility-administered medications for this visit.    Patient confirms/reports the following allergies:  No Known Allergies  No orders of the defined types were placed in this encounter.   AUTHORIZATION INFORMATION Primary Insurance: 1D#: Group #:  Secondary Insurance: 1D#: Group #:  SCHEDULE INFORMATION: Date: 03/10/20 Time: Location:[facility]

## 2020-03-15 ENCOUNTER — Ambulatory Visit
Admission: RE | Admit: 2020-03-15 | Discharge: 2020-03-15 | Disposition: A | Discharge status: Home / Self Care | Payer: BC Managed Care – PPO | Source: Ambulatory Visit | Attending: Family Medicine | Admitting: Family Medicine

## 2020-03-15 ENCOUNTER — Other Ambulatory Visit: Payer: Self-pay

## 2020-03-15 DIAGNOSIS — Z1231 Encounter for screening mammogram for malignant neoplasm of breast: Secondary | ICD-10-CM

## 2020-03-22 ENCOUNTER — Other Ambulatory Visit
Admission: RE | Admit: 2020-03-22 | Discharge: 2020-03-22 | Disposition: A | Discharge status: Home / Self Care | Payer: BC Managed Care – PPO | Source: Ambulatory Visit | Attending: Gastroenterology | Admitting: Gastroenterology

## 2020-03-22 ENCOUNTER — Other Ambulatory Visit: Payer: Self-pay

## 2020-03-22 DIAGNOSIS — K635 Polyp of colon: Secondary | ICD-10-CM | POA: Diagnosis not present

## 2020-03-22 DIAGNOSIS — Z20822 Contact with and (suspected) exposure to covid-19: Secondary | ICD-10-CM | POA: Insufficient documentation

## 2020-03-22 DIAGNOSIS — D123 Benign neoplasm of transverse colon: Secondary | ICD-10-CM | POA: Diagnosis not present

## 2020-03-22 DIAGNOSIS — Z1211 Encounter for screening for malignant neoplasm of colon: Secondary | ICD-10-CM | POA: Diagnosis not present

## 2020-03-22 DIAGNOSIS — K648 Other hemorrhoids: Secondary | ICD-10-CM | POA: Diagnosis not present

## 2020-03-22 DIAGNOSIS — Z01812 Encounter for preprocedural laboratory examination: Secondary | ICD-10-CM | POA: Insufficient documentation

## 2020-03-23 ENCOUNTER — Encounter: Payer: Self-pay | Admitting: Gastroenterology

## 2020-03-23 LAB — SARS CORONAVIRUS 2 (TAT 6-24 HRS): SARS Coronavirus 2: NEGATIVE

## 2020-03-24 ENCOUNTER — Encounter
Admission: RE | Disposition: A | Discharge status: Home / Self Care | Payer: Self-pay | Source: Home / Self Care | Attending: Gastroenterology

## 2020-03-24 ENCOUNTER — Other Ambulatory Visit: Payer: Self-pay

## 2020-03-24 ENCOUNTER — Ambulatory Visit: Payer: BC Managed Care – PPO | Admitting: Anesthesiology

## 2020-03-24 ENCOUNTER — Ambulatory Visit
Admission: RE | Admit: 2020-03-24 | Discharge: 2020-03-24 | Disposition: A | Discharge status: Home / Self Care | Payer: BC Managed Care – PPO | Attending: Gastroenterology | Admitting: Gastroenterology

## 2020-03-24 ENCOUNTER — Encounter: Payer: Self-pay | Admitting: Gastroenterology

## 2020-03-24 DIAGNOSIS — K648 Other hemorrhoids: Secondary | ICD-10-CM | POA: Insufficient documentation

## 2020-03-24 DIAGNOSIS — Z1211 Encounter for screening for malignant neoplasm of colon: Secondary | ICD-10-CM | POA: Diagnosis not present

## 2020-03-24 DIAGNOSIS — D123 Benign neoplasm of transverse colon: Secondary | ICD-10-CM | POA: Insufficient documentation

## 2020-03-24 DIAGNOSIS — K635 Polyp of colon: Secondary | ICD-10-CM

## 2020-03-24 DIAGNOSIS — Z20822 Contact with and (suspected) exposure to covid-19: Secondary | ICD-10-CM | POA: Insufficient documentation

## 2020-03-24 HISTORY — PX: COLONOSCOPY WITH PROPOFOL: SHX5780

## 2020-03-24 LAB — POCT PREGNANCY, URINE: Preg Test, Ur: NEGATIVE

## 2020-03-24 SURGERY — COLONOSCOPY WITH PROPOFOL
Anesthesia: General

## 2020-03-24 MED ORDER — PROPOFOL 10 MG/ML IV BOLUS
INTRAVENOUS | Status: DC | PRN
  Administered 2020-03-24: 20 mg via INTRAVENOUS
  Administered 2020-03-24: 10 mg via INTRAVENOUS
  Administered 2020-03-24: 20 mg via INTRAVENOUS
  Administered 2020-03-24: 40 mg via INTRAVENOUS
  Administered 2020-03-24: 10 mg via INTRAVENOUS

## 2020-03-24 MED ORDER — PROPOFOL 10 MG/ML IV BOLUS
INTRAVENOUS | Status: AC
  Filled 2020-03-24: qty 20

## 2020-03-24 MED ORDER — PROPOFOL 500 MG/50ML IV EMUL
INTRAVENOUS | Status: DC | PRN
  Administered 2020-03-24: 180 ug/kg/min via INTRAVENOUS

## 2020-03-24 MED ORDER — SODIUM CHLORIDE (PF) 0.9 % IJ SOLN
INTRAMUSCULAR | Status: DC | PRN
  Administered 2020-03-24: 12 mL

## 2020-03-24 MED ORDER — SODIUM CHLORIDE 0.9 % IV SOLN: INTRAVENOUS | Status: DC

## 2020-03-24 MED ORDER — PROPOFOL 500 MG/50ML IV EMUL
INTRAVENOUS | Status: AC
  Filled 2020-03-24: qty 50

## 2020-03-24 NOTE — H&P (Signed)
Vonda Antigua, MD [address], [place], [place], [place], [address] [address], [place], [place], [place], [address] Phone: [phone]  Fax: [phone]  Primary Care Physician:  Delsa Grana, PA-C   Pre-Procedure History & Physical: HPI:  Erica Weber is a 49 y.o. female is here for a colonoscopy.   Past Medical History:  Diagnosis Date  . Allergy   . Asthma   . Dysplastic nevus 04/27/2017   Right leg, left shoulder, left thigh, sees dermatologist, Dr. Ree Edman  . Heart murmur     Past Surgical History:  Procedure Laterality Date  . BREAST BIOPSY Right 03/06/2016   benign  . BREAST SURGERY Bilateral 2000   Breast Reduction  . CHOLECYSTECTOMY  1999  . LIPOSUCTION  2012   Fat deposit in back  . REDUCTION MAMMAPLASTY Bilateral    1999    Prior to Admission medications   Medication Sig Start Date End Date Taking? Authorizing Provider  cholecalciferol (VITAMIN D3) 25 MCG (1000 UNIT) tablet Take 1,000 Units by mouth daily.   Yes [provider]  montelukast (SINGULAIR) 10 MG tablet Take 10 mg by mouth at bedtime.    Yes [provider]  Olopatadine HCl (PATADAY OP) Apply 1 drop to eye as needed.   Yes [provider]  vitamin B-12 (CYANOCOBALAMIN) 100 MCG tablet Take 100 mcg by mouth daily.   Yes [provider]  estradiol (ESTRACE) 0.1 MG/GM vaginal cream Use one vaginal applicator QHS x 2 weeks and then 2x weekly Patient not taking: Reported on 02/11/2020 01/02/20   Delsa Grana, PA-C  fluticasone (FLONASE) 50 MCG/ACT nasal spray Place 1 spray into both nostrils daily.    [provider]  levocetirizine (XYZAL) 5 MG tablet Take 5 mg by mouth at bedtime. 04/10/19   [provider]    Allergies as of 02/11/2020  . (No Known Allergies)    Family History  Problem Relation Age of Onset  . Cancer Mother        lung  . Diabetes Mother   . Stroke Father   . Hyperlipidemia Father   . Down  syndrome Son   . Breast cancer Maternal Grandmother        50s  . Breast cancer Paternal Grandmother        67s  . Breast cancer Other   . Cancer Maternal Aunt        ovar vs uterine - lung ca 2020    Social History   Socioeconomic History  . Marital status: Married    Spouse name: sanuel  . Number of children: 1  . Years of education: 12  . Highest education level: Master's degree (e.g., MA, MS, MEng, MEd, MSW, MBA)  Occupational History  . Not on file  Tobacco Use  . Smoking status: Never Smoker  . Smokeless tobacco: Never Used  Vaping Use  . Vaping Use: Never used  Substance and Sexual Activity  . Alcohol use: Yes    Alcohol/week: 3.0 - 4.0 standard drinks    Types: 3 - 4 Standard drinks or equivalent per week  . Drug use: No  . Sexual activity: Yes    Partners: Male    Birth control/protection: None  Other Topics Concern  . Not on file  Social History Narrative  . Not on file   Social Determinants of Health   Financial Resource Strain:   . Difficulty of Paying Living Expenses: Not on file  Food Insecurity:   .  Worried About Charity fundraiser in the Last Year: Not on file  . Ran Out of Food in the Last Year: Not on file  Transportation Needs:   . Lack of Transportation (Medical): Not on file  . Lack of Transportation (Non-Medical): Not on file  Physical Activity:   . Days of Exercise per Week: Not on file  . Minutes of Exercise per Session: Not on file  Stress:   . Feeling of Stress : Not on file  Social Connections:   . Frequency of Communication with Friends and Family: Not on file  . Frequency of Social Gatherings with Friends and Family: Not on file  . Attends Religious Services: Not on file  . Active Member of Clubs or Organizations: Not on file  . Attends Archivist Meetings: Not on file  . Marital Status: Not on file  Intimate Partner Violence:   . Fear of Current or Ex-Partner: Not on file  . Emotionally Abused: Not on file  .  Physically Abused: Not on file  . Sexually Abused: Not on file    Review of Systems: See HPI, otherwise negative ROS  Physical Exam: BP 114/61   Pulse 76   Temp 97.7 F (36.5 C) (Temporal)   Resp 18   Ht 5\' 6"  (1.676 m)   Wt 66.2 kg   LMP 01/01/2018   SpO2 100%   BMI 23.57 kg/m  General:   Alert,  pleasant and cooperative in NAD Head:  Normocephalic and atraumatic. Neck:  Supple; no masses or thyromegaly. Lungs:  Clear throughout to auscultation, normal respiratory effort.    Heart:  +S1, +S2, Regular rate and rhythm, No edema. Abdomen:  Soft, nontender and nondistended. Normal bowel sounds, without guarding, and without rebound.   Neurologic:  Alert and  oriented x4;  grossly normal neurologically.  Impression/Plan: Erica Weber is here for a colonoscopy to be performed for average risk screening.  Risks, benefits, limitations, and alternatives regarding  colonoscopy have been reviewed with the patient.  Questions have been answered.  All parties agreeable.   Virgel Manifold, MD  03/24/2020, 10:22 AM

## 2020-03-24 NOTE — Op Note (Signed)
[facility] Gastroenterology Patient Name: Erica Weber Procedure Date: 03/24/2020 10:04 AM MRN: [id number] Account #: [id number] Date of Birth: 1970/07/18 Admit Type: Outpatient Age: 49 Room: [facility] ENDO ROOM 3 Gender: Female Note Status: Finalized Procedure:             Colonoscopy Indications:           Screening for colorectal malignant neoplasm Providers:             Dimitrius Steedman B. Bonna Gains MD, MD Referring MD:          Delsa Grana (Referring MD) Medicines:             Monitored Anesthesia Care Complications:         No immediate complications. Procedure:             Pre-Anesthesia Assessment:                        - ASA Grade Assessment: II - A patient with mild                         systemic disease.                        - Prior to the procedure, a History and Physical was                         performed, and patient medications, allergies and                         sensitivities were reviewed. The patient's tolerance                         of previous anesthesia was reviewed.                        - The risks and benefits of the procedure and the                         sedation options and risks were discussed with the                         patient. All questions were answered and informed                         consent was obtained.                        - Patient identification and proposed procedure were                         verified prior to the procedure by the physician, the                         nurse, the anesthesiologist, the anesthetist and the                         technician. The procedure was verified in the                         procedure room.  After obtaining informed consent, the colonoscope was                         passed under direct vision. Throughout the procedure,                         the patient's blood pressure, pulse, and oxygen                         saturations were monitored  continuously. The                         Colonoscope was introduced through the anus and                         advanced to the the cecum, identified by appendiceal                         orifice and ileocecal valve. The colonoscopy was                         performed with ease. The patient tolerated the                         procedure well. The quality of the bowel preparation                         was fair. Findings:      The perianal and digital rectal examinations were normal.      A 10 mm polyp was found in the cecum. The polyp was flat. Area was       successfully injected with 5 mL saline for lesion assessment, and this       injection appeared to lift the lesion adequately. The polyp was removed       with a hot snare. Resection and retrieval were complete. To prevent       bleeding after the polypectomy, two hemostatic clips were successfully       placed. There was no bleeding at the end of the procedure.      A 5 mm polyp was found in the transverse colon. The polyp was flat. The       polyp was removed with a jumbo cold forceps. Resection and retrieval       were complete.      The exam was otherwise without abnormality.      The rectum, sigmoid colon, descending colon, transverse colon, ascending       colon and cecum appeared normal.      Non-bleeding internal hemorrhoids were found during retroflexion. Impression:            - Preparation of the colon was fair.                        - One 10 mm polyp in the cecum, removed with a hot                         snare. Resected and retrieved. Injected. Clips were  placed.                        - One 5 mm polyp in the transverse colon, removed with                         a jumbo cold forceps. Resected and retrieved.                        - The examination was otherwise normal.                        - The rectum, sigmoid colon, descending colon,                         transverse colon,  ascending colon and cecum are normal.                        - Non-bleeding internal hemorrhoids. Recommendation:        - Discharge patient to home (with escort).                        - Advance diet as tolerated.                        - Continue present medications.                        - Await pathology results.                        - Repeat colonoscopy date to be determined after                         pending pathology results are reviewed.                        - The findings and recommendations were discussed with                         the patient.                        - The findings and recommendations were discussed with                         the patient's family.                        - Return to primary care physician as previously                         scheduled.                        - High fiber diet. Procedure Code(s):     --- Professional ---                        [phone], Colonoscopy, flexible; with removal of  tumor(s), polyp(s), or other lesion(s) by snare                         technique                        45381, Colonoscopy, flexible; with directed submucosal                         injection(s), any substance                        27741, 59, Colonoscopy, flexible; with biopsy, single                         or multiple Diagnosis Code(s):     --- Professional ---                        Z12.11, Encounter for screening for malignant neoplasm                         of colon                        K63.5, Polyp of colon CPT copyright 2019 American Medical Association. All rights reserved. The codes documented in this report are preliminary and upon coder review may  be revised to meet current compliance requirements.  Vonda Antigua, MD Margretta Sidle B. Bonna Gains MD, MD 03/24/2020 11:01:51 AM This report has been signed electronically. Number of Addenda: 0 Note Initiated On: 03/24/2020 10:04 AM Scope Withdrawal Time: 0  hours 26 minutes 5 seconds  Total Procedure Duration: 0 hours 30 minutes 41 seconds  Estimated Blood Loss:  Estimated blood loss: none.      [facility]

## 2020-03-24 NOTE — Anesthesia Preprocedure Evaluation (Signed)
Anesthesia Evaluation  Patient identified by MRN, date of birth, ID band Patient awake    Reviewed: Allergy & Precautions, H&P , NPO status , Patient's Chart, lab work & pertinent test results, reviewed documented beta blocker date and time   Airway Mallampati: II   Neck ROM: full    Dental  (+) Teeth Intact   Pulmonary asthma ,    Pulmonary exam normal        Cardiovascular Exercise Tolerance: Good Normal cardiovascular exam+ Valvular Problems/Murmurs  Rhythm:regular Rate:Normal     Neuro/Psych negative neurological ROS  negative psych ROS   GI/Hepatic negative GI ROS, Neg liver ROS,   Endo/Other  negative endocrine ROS  Renal/GU negative Renal ROS  negative genitourinary   Musculoskeletal   Abdominal   Peds  Hematology negative hematology ROS (+)   Anesthesia Other Findings Past Medical History: No date: Allergy No date: Asthma 04/27/2017: Dysplastic nevus     Comment:  Right leg, left shoulder, left thigh, sees               dermatologist, Dr. Ree Edman No date: Heart murmur Past Surgical History: 03/06/2016: BREAST BIOPSY; Right     Comment:  benign 2000: BREAST SURGERY; Bilateral     Comment:  Breast Reduction 1999: CHOLECYSTECTOMY 2012: LIPOSUCTION     Comment:  Fat deposit in back No date: REDUCTION MAMMAPLASTY; Bilateral     Comment:  1999 BMI    Body Mass Index: 23.57 kg/m     Reproductive/Obstetrics negative OB ROS                             Anesthesia Physical Anesthesia Plan  ASA: II  Anesthesia Plan: General   Post-op Pain Management:    Induction:   PONV Risk Score and Plan:   Airway Management Planned:   Additional Equipment:   Intra-op Plan:   Post-operative Plan:   Informed Consent: I have reviewed the patients History and Physical, chart, labs and discussed the procedure including the risks, benefits and alternatives for the  proposed anesthesia with the patient or authorized representative who has indicated his/her understanding and acceptance.     Dental Advisory Given  Plan Discussed with: CRNA  Anesthesia Plan Comments:         Anesthesia Quick Evaluation

## 2020-03-24 NOTE — Transfer of Care (Signed)
Immediate Anesthesia Transfer of Care Note  Patient: Erica Weber  Procedure(s) Performed: COLONOSCOPY WITH PROPOFOL (N/A )  Patient Location: PACU  Anesthesia Type:General  Level of Consciousness: awake, alert  and oriented  Airway & Oxygen Therapy: Patient Spontanous Breathing and Patient connected to nasal cannula oxygen  Post-op Assessment: Report given to RN and Post -op Vital signs reviewed and stable  Post vital signs: Reviewed and stable  Last Vitals:  Vitals Value Taken Time  BP 96/60 03/24/20 1103  Temp 35.8 C 03/24/20 1103  Pulse 76 03/24/20 1103  Resp 26 03/24/20 1103  SpO2 98 % 03/24/20 1103  Vitals shown include unvalidated device data.  Last Pain:  Vitals:   03/24/20 1103  TempSrc: Temporal  PainSc:          Complications: No complications documented.

## 2020-03-24 NOTE — Anesthesia Procedure Notes (Signed)
Date/Time: 03/24/2020 10:15 AM Performed by: Allean Found, CRNA Pre-anesthesia Checklist: Patient identified, Emergency Drugs available, Suction available, Patient being monitored and Timeout performed Patient Re-evaluated:Patient Re-evaluated prior to induction Oxygen Delivery Method: Nasal cannula Placement Confirmation: positive ETCO2

## 2020-03-25 ENCOUNTER — Encounter: Payer: Self-pay | Admitting: Gastroenterology

## 2020-03-25 LAB — SURGICAL PATHOLOGY

## 2020-03-25 NOTE — Anesthesia Postprocedure Evaluation (Signed)
Anesthesia Post Note  Patient: Erica Weber  Procedure(s) Performed: COLONOSCOPY WITH PROPOFOL (N/A )  Patient location during evaluation: PACU Anesthesia Type: General Level of consciousness: awake and alert Pain management: pain level controlled Vital Signs Assessment: post-procedure vital signs reviewed and stable Respiratory status: spontaneous breathing, nonlabored ventilation, respiratory function stable and patient connected to nasal cannula oxygen Cardiovascular status: blood pressure returned to baseline and stable Postop Assessment: no apparent nausea or vomiting Anesthetic complications: no   No complications documented.   Last Vitals:  Vitals:   03/24/20 1123 03/24/20 1133  BP: 104/69 107/76  Pulse: 64 62  Resp: 17 18  Temp:    SpO2: 100% 100%    Last Pain:  Vitals:   03/25/20 0732  TempSrc:   PainSc: 0-No pain                 Molli Barrows

## 2020-03-29 ENCOUNTER — Encounter: Payer: Self-pay | Admitting: Gastroenterology

## 2020-04-29 ENCOUNTER — Encounter: Payer: BC Managed Care – PPO | Admitting: Family Medicine

## 2020-06-25 ENCOUNTER — Encounter: Payer: BC Managed Care – PPO | Admitting: Family Medicine

## 2020-09-22 ENCOUNTER — Encounter: Payer: Self-pay | Admitting: Family Medicine

## 2020-12-31 ENCOUNTER — Encounter: Payer: Self-pay | Admitting: Family Medicine

## 2021-06-06 ENCOUNTER — Other Ambulatory Visit: Payer: Self-pay | Admitting: Family Medicine

## 2021-06-06 DIAGNOSIS — Z1231 Encounter for screening mammogram for malignant neoplasm of breast: Secondary | ICD-10-CM

## 2021-06-09 ENCOUNTER — Ambulatory Visit
Admission: RE | Admit: 2021-06-09 | Discharge: 2021-06-09 | Disposition: A | Discharge status: Home / Self Care | Payer: 59 | Source: Ambulatory Visit | Attending: Family Medicine | Admitting: Family Medicine

## 2021-06-09 DIAGNOSIS — Z1231 Encounter for screening mammogram for malignant neoplasm of breast: Secondary | ICD-10-CM

## 2021-10-06 ENCOUNTER — Telehealth: Payer: Self-pay

## 2021-11-16 NOTE — Progress Notes (Unsigned)
Cardiology Office Note:    Date:  11/17/2021   ID:  Erica Weber, DOB 1970-08-27, MRN [id number]  PCP:  Delsa Grana, PA-C  Cardiologist:  None   Referring MD: Delsa Grana, PA-C   Chief Complaint  Patient presents with   Loss of Consciousness    Near syncope    History of Present Illness:    Erica Weber is a 51 y.o. female with a hx of near syncope in June 2023.   Has history of PVCs.  These date back greater than 20 years.  Initially identified by her OB/GYN.  He then referred her back to the primary care doctor.  PVCs resolved.  In 2016 she was going to have a surgical procedure but was having so many premature beats that the procedure was canceled.  She saw Dr. Bartholome Bill.  An exercise treadmill test on monitor was performed and did not reveal anything that seem to be significant.  She eventually had the surgery, had no symptoms, and even when she was having the PVCs, cannot feel them.  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  She began feeling hot.  She did not have any sensation of palpitation or heart racing.  There was no chest pain, shortness of breath, or other complaint.  She works at the health department.  She left the cookout drive-through line and went back to work where she was checked out and blood pressure was 140/80, and heart rate was in the 80s.  Her PVCs in the past have been correlated with the use of pseudoephedrine and caffeine.  She very rarely uses either at this time.  Her father was a smoker and had CVAs.  Her paternal grandfather had myocardial infarction's.  Past Medical History:  Diagnosis Date   Allergy    Asthma    Dysplastic nevus 04/27/2017   Right leg, left shoulder, left thigh, sees dermatologist, Dr. Ree Edman   Heart murmur    Palpitations     Past Surgical History:  Procedure Laterality Date   BREAST BIOPSY Right 03/06/2016   benign   BREAST SURGERY  Bilateral 2000   Breast Reduction   CHOLECYSTECTOMY  1999   COLONOSCOPY WITH PROPOFOL N/A 03/24/2020   Procedure: COLONOSCOPY WITH PROPOFOL;  Surgeon: Virgel Manifold, MD;  Location: [facility] END[facility];  Service: Endoscopy;  Laterality: N/A;   LIPOSUCTION  2012   Fat deposit in back   REDUCTION MAMMAPLASTY Bilateral    1999    Current Medications: Current Meds  Medication Sig   cetirizine (ZYRTEC) 10 MG chewable tablet Chew 10 mg by mouth daily.   cholecalciferol (VITAMIN D3) 25 MCG (1000 UNIT) tablet Take 1,000 Units by mouth daily.   estradiol (ESTRACE) 0.1 MG/GM vaginal cream Use one vaginal applicator QHS x 2 weeks and then 2x weekly   fluticasone (FLONASE) 50 MCG/ACT nasal spray Place 1 spray into both nostrils daily.   montelukast (SINGULAIR) 10 MG tablet Take 10 mg by mouth at bedtime.    Olopatadine HCl (PATADAY OP) Apply 1 drop to eye as needed.   vitamin B-12 (CYANOCOBALAMIN) 100 MCG tablet Take 100 mcg by mouth daily.     Allergies:   Patient has no known allergies.   Social History   Socioeconomic History   Marital status: Married    Spouse name: sanuel   Number of children: 1   Years of education: 12   Highest education  level: Master's degree (e.g., MA, MS, MEng, MEd, MSW, MBA)  Occupational History   Not on file  Tobacco Use   Smoking status: Never   Smokeless tobacco: Never  Vaping Use   Vaping Use: Never used  Substance and Sexual Activity   Alcohol use: Yes    Alcohol/week: 3.0 - 4.0 standard drinks of alcohol    Types: 3 - 4 Standard drinks or equivalent per week   Drug use: No   Sexual activity: Yes    Partners: Male    Birth control/protection: None  Other Topics Concern   Not on file  Social History Narrative   Not on file   Social Determinants of Health   Financial Resource Strain: Low Risk  (05/03/2018)   Overall Financial Resource Strain (CARDIA)    Difficulty of Paying Living Expenses: Not hard at all  Food Insecurity: No Food  Insecurity (05/03/2018)   Hunger Vital Sign    Worried About Running Out of Food in the Last Year: Never true    Ran Out of Food in the Last Year: Never true  Transportation Needs: No Transportation Needs (05/03/2018)   PRAPARE - Hydrologist (Medical): No    Lack of Transportation (Non-Medical): No  Physical Activity: Inactive (05/03/2018)   Exercise Vital Sign    Days of Exercise per Week: 0 days    Minutes of Exercise per Session: 0 min  Stress: No Stress Concern Present (05/03/2018)   Colp    Feeling of Stress : Not at all  Social Connections: [place] (05/03/2018)   Social Connection and Isolation Panel [NHANES]    Frequency of Communication with Friends and Family: Twice a week    Frequency of Social Gatherings with Friends and Family: Twice a week    Attends Religious Services: More than 4 times per year    Active Member of [employer] or Organizations: Yes    Attends Music therapist: More than 4 times per year    Marital Status: Married     Family History: The patient's family history includes Breast cancer in her maternal grandmother, paternal grandmother, and another family member; Cancer in her maternal aunt and mother; Diabetes in her mother; Down syndrome in her son; Hyperlipidemia in her father; Stroke in her father.  ROS:   Please see the history of present illness.    She is otherwise healthy.  No history of heart disease.  She denies chest pain.  All other systems reviewed and are negative.  EKGs/Labs/Other Studies Reviewed:    The following studies were reviewed today: No cardiac imaging  EKG:  EKG sinus rhythm at 72 bpm with normal EKG appearance.  Recent Labs: No results found for requested labs within last 365 days.  Recent Lipid Panel    Component Value Date/Time   CHOL 186 01/02/2020 1515   CHOL 142 11/25/2014 0833   TRIG 156 (H)  01/02/2020 1515   HDL 50 01/02/2020 1515   HDL 55 11/25/2014 0833   CHOLHDL 3.7 01/02/2020 1515   VLDL 25 04/06/2016 1149   LDLCALC 109 (H) 01/02/2020 1515    Physical Exam:    VS:  BP 112/80   Pulse 72   Ht '5\' 6"'$  (1.676 m)   Wt 157 lb 6.4 oz (71.4 kg)   LMP 01/01/2018   SpO2 98%   BMI 25.41 kg/m     Wt Readings from Last 3 Encounters:  11/17/21 157 lb 6.4 oz (71.4 kg)  03/24/20 146 lb (66.2 kg)  01/02/20 152 lb 11.2 oz (69.3 kg)     GEN: Thin appearing. No acute distress HEENT: Normal NECK: No JVD. LYMPHATICS: No lymphadenopathy CARDIAC: No murmur. RRR no gallop, or edema. VASCULAR:  Normal Pulses. No bruits. RESPIRATORY:  Clear to auscultation without rales, wheezing or rhonchi  ABDOMEN: Soft, non-tender, non-distended, No pulsatile mass, MUSCULOSKELETAL: No deformity  SKIN: Warm and dry NEUROLOGIC:  Alert and oriented x 3 PSYCHIATRIC:  Normal affect   ASSESSMENT:    1. Near syncope   2. Asymptomatic PVCs    PLAN:    In order of problems listed above:  Likely vasovagal/neurocardiogenic.  2-week monitor given prior history of PVCs. 2D Doppler echocardiogram to assess structural integrity of the heart.  If monitor and echo are normal as I suspect they will be, clinical observation will be the approach.   Medication Adjustments/Labs and Tests Ordered: Current medicines are reviewed at length with the patient today.  Concerns regarding medicines are outlined above.  No orders of the defined types were placed in this encounter.  No orders of the defined types were placed in this encounter.   There are no Patient Instructions on file for this visit.   Signed, Sinclair Grooms, MD  11/17/2021 10:27 AM    [place]

## 2021-11-17 ENCOUNTER — Ambulatory Visit (INDEPENDENT_AMBULATORY_CARE_PROVIDER_SITE_OTHER): Payer: 59

## 2021-11-17 ENCOUNTER — Ambulatory Visit: Payer: 59 | Admitting: Interventional Cardiology

## 2021-11-17 ENCOUNTER — Encounter: Payer: Self-pay | Admitting: Interventional Cardiology

## 2021-11-17 VITALS — BP 112/80 | HR 72 | Ht 66.0 in | Wt 157.4 lb

## 2021-11-17 DIAGNOSIS — R55 Syncope and collapse: Secondary | ICD-10-CM

## 2021-11-17 DIAGNOSIS — I493 Ventricular premature depolarization: Secondary | ICD-10-CM | POA: Diagnosis not present

## 2021-11-17 NOTE — Patient Instructions (Signed)
Medication Instructions:  Your physician recommends that you continue on your current medications as directed. Please refer to the Current Medication list given to you today.  *If you need a refill on your cardiac medications before your next appointment, please call your pharmacy*  Lab Work: NONE  Testing/Procedures: Your physician has requested that you have an echocardiogram. Echocardiography is a painless test that uses sound waves to create images of your heart. It provides your doctor with information about the size and shape of your heart and how well your heart's chambers and valves are working. This procedure takes approximately one hour. There are no restrictions for this procedure.  Your physician has requested that you wear a Zio heart monitor for 2 weeks (14 days).  Follow-Up: At [facility], you and your health needs are our priority.  As part of our continuing mission to provide you with exceptional heart care, we have created designated Provider Care Teams.  These Care Teams include your primary Cardiologist (physician) and Advanced Practice Providers (APPs -  Physician Assistants and Nurse Practitioners) who all work together to provide you with the care you need, when you need it.  Your next appointment:   Will be determined based on results of echocardiogram and heart monitor.  Other Instructions ZIO XT- Long Term Monitor Instructions  Your physician has requested you wear a ZIO patch monitor for 14 days.  This is a single patch monitor. [employer] supplies one patch monitor per enrollment. Additional stickers are not available. Please do not apply patch if you will be having a Nuclear Stress Test,  Echocardiogram, Cardiac CT, MRI, or Chest Xray during the period you would be wearing the  monitor. The patch cannot be worn during these tests. You cannot remove and re-apply the  ZIO XT patch monitor.  Your ZIO patch monitor will be mailed 3 day USPS to your address on  file. It may take 3-5 days  to receive your monitor after you have been enrolled.  Once you have received your monitor, please review the enclosed instructions. Your monitor  has already been registered assigning a specific monitor serial # to you.  Billing and Patient Assistance Program Information  We have supplied [employer] with any of your insurance information on file for billing purposes. [employer] offers a sliding scale Patient Assistance Program for patients that do not have  insurance, or whose insurance does not completely cover the cost of the ZIO monitor.  You must apply for the Patient Assistance Program to qualify for this discounted rate.  To apply, please call [employer] at [phone], select option 4, select option 2, ask to apply for  Patient Assistance Program. Theodore Demark will ask your household income, and how many people  are in your household. They will quote your out-of-pocket cost based on that information.  [employer] will also be able to set up a 14-month interest-free payment plan if needed.  Applying the monitor   Shave hair from upper left chest.  Hold abrader disc by orange tab. Rub abrader in 40 strokes over the upper left chest as  indicated in your monitor instructions.  Clean area with 4 enclosed alcohol pads. Let dry.  Apply patch as indicated in monitor instructions. Patch will be placed under collarbone on left  side of chest with arrow pointing upward.  Rub patch adhesive wings for 2 minutes. Remove white label marked "1". Remove the white  label marked "2". Rub patch adhesive wings for 2 additional minutes.  While looking in  a mirror, press and release button in center of patch. A small green light will  flash 3-4 times. This will be your only indicator that the monitor has been turned on.  Do not shower for the first 24 hours. You may shower after the first 24 hours.  Press the button if you feel a symptom. You will hear a small click. Record Date, Time and   Symptom in the Patient Logbook.  When you are ready to remove the patch, follow instructions on the last 2 pages of Patient  Logbook. Stick patch monitor onto the last page of Patient Logbook.  Place Patient Logbook in the blue and white box. Use locking tab on box and tape box closed  securely. The blue and white box has prepaid postage on it. Please place it in the mailbox as  soon as possible. Your physician should have your test results approximately 7 days after the  monitor has been mailed back to [facility].  Call [facility] at [phone] if you have questions regarding  your ZIO XT patch monitor. Call them immediately if you see an orange light blinking on your  monitor.  If your monitor falls off in less than 4 days, contact our Monitor department at [phone].  If your monitor becomes loose or falls off after 4 days call [employer] at [phone] for  suggestions on securing your monitor  Important Information About Sugar

## 2021-11-17 NOTE — Progress Notes (Unsigned)
ZIO XT serial # [id number] from office inventory applied to patient.  Dr. Tamala Julian to read.

## 2021-12-08 ENCOUNTER — Ambulatory Visit (HOSPITAL_COMMUNITY): Payer: 59 | Attending: Cardiology

## 2021-12-08 DIAGNOSIS — R55 Syncope and collapse: Secondary | ICD-10-CM | POA: Diagnosis present

## 2021-12-08 LAB — ECHOCARDIOGRAM COMPLETE
Area-P 1/2: 3.42 cm2
S' Lateral: 2.9 cm

## 2022-01-16 ENCOUNTER — Encounter: Payer: Self-pay | Admitting: Interventional Cardiology

## 2022-01-20 NOTE — Telephone Encounter (Signed)
Erica Crome, MD     Okay to use

## 2022-07-24 ENCOUNTER — Other Ambulatory Visit: Payer: Self-pay | Admitting: *Deleted

## 2022-07-24 DIAGNOSIS — Z0000 Encounter for general adult medical examination without abnormal findings: Secondary | ICD-10-CM

## 2022-08-30 ENCOUNTER — Ambulatory Visit
Admission: RE | Admit: 2022-08-30 | Discharge: 2022-08-30 | Disposition: A | Discharge status: Home / Self Care | Payer: 59 | Source: Ambulatory Visit

## 2022-08-30 DIAGNOSIS — Z0000 Encounter for general adult medical examination without abnormal findings: Secondary | ICD-10-CM

## 2022-11-24 ENCOUNTER — Other Ambulatory Visit: Payer: Self-pay | Admitting: Obstetrics & Gynecology

## 2022-11-24 DIAGNOSIS — N63 Unspecified lump in unspecified breast: Secondary | ICD-10-CM

## 2022-12-20 ENCOUNTER — Ambulatory Visit: Admission: RE | Admit: 2022-12-20 | Payer: 59 | Source: Ambulatory Visit

## 2022-12-20 DIAGNOSIS — N63 Unspecified lump in unspecified breast: Secondary | ICD-10-CM

## 2023-03-07 ENCOUNTER — Ambulatory Visit
Admission: RE | Admit: 2023-03-07 | Discharge: 2023-03-07 | Disposition: A | Discharge status: Home / Self Care | Payer: 59 | Source: Ambulatory Visit | Attending: Internal Medicine | Admitting: Internal Medicine

## 2023-03-07 ENCOUNTER — Other Ambulatory Visit: Payer: Self-pay | Admitting: Internal Medicine

## 2023-03-07 DIAGNOSIS — M7989 Other specified soft tissue disorders: Secondary | ICD-10-CM

## 2023-03-09 ENCOUNTER — Encounter: Payer: Self-pay | Admitting: *Deleted

## 2023-04-19 ENCOUNTER — Telehealth: Payer: Self-pay

## 2023-04-19 NOTE — Telephone Encounter (Signed)
 The patient called in and left a voicemail requesting for Erica Weber to call her back. She wants to schedule her colonoscopy. I called the patient back to let her know we got her message and no answer I sent message scheduler and transfer to [facility].

## 2023-04-20 ENCOUNTER — Telehealth: Payer: Self-pay

## 2023-04-20 ENCOUNTER — Other Ambulatory Visit: Payer: Self-pay

## 2023-04-20 DIAGNOSIS — Z860100 Personal history of colon polyps, unspecified: Secondary | ICD-10-CM

## 2023-04-20 NOTE — Telephone Encounter (Signed)
 Gastroenterology Pre-Procedure Review  Request Date: 05/14/23 Requesting Physician: Dr. Unk  PATIENT REVIEW QUESTIONS: The patient responded to the following health history questions as indicated:    1. Are you having any GI issues? no 2. Do you have a personal history of Polyps? yes (last colonoscopy performed by Dr. Rada 03/24/20 recommended repeat in 3 years) 3. Do you have a family history of Colon Cancer or Polyps? no 4. Diabetes Mellitus? no 5. Joint replacements in the past 12 months?no 6. Major health problems in the past 3 months?no 7. Any artificial heart valves, MVP, or defibrillator?no    MEDICATIONS & ALLERGIES:    Patient reports the following regarding taking any anticoagulation/antiplatelet therapy:   Plavix, Coumadin, Eliquis, Xarelto, Lovenox, Pradaxa, Brilinta, or Effient? no Aspirin? no  Patient confirms/reports the following medications:  Current Outpatient Medications  Medication Sig Dispense Refill   cetirizine (ZYRTEC) 10 MG chewable tablet Chew 10 mg by mouth daily.     cholecalciferol (VITAMIN D3) 25 MCG (1000 UNIT) tablet Take 1,000 Units by mouth daily.     estradiol (ESTRACE) 0.1 MG/GM vaginal cream Use one vaginal applicator QHS x 2 weeks and then 2x weekly 42.5 g 5   fluticasone (FLONASE) 50 MCG/ACT nasal spray Place 1 spray into both nostrils daily.     montelukast (SINGULAIR) 10 MG tablet Take 10 mg by mouth at bedtime.      Olopatadine HCl (PATADAY OP) Apply 1 drop to eye as needed.     vitamin B-12 (CYANOCOBALAMIN ) 100 MCG tablet Take 100 mcg by mouth daily.     No current facility-administered medications for this visit.    Patient confirms/reports the following allergies:  No Known Allergies  No orders of the defined types were placed in this encounter.   AUTHORIZATION INFORMATION Primary Insurance: 1D#: Group #:  Secondary Insurance: 1D#: Group #:  SCHEDULE INFORMATION: Date: 05/14/23 Time: Location: [facility]

## 2023-05-07 ENCOUNTER — Encounter: Payer: Self-pay | Admitting: Gastroenterology

## 2023-05-11 ENCOUNTER — Encounter: Payer: Self-pay | Admitting: Gastroenterology

## 2023-05-14 ENCOUNTER — Ambulatory Visit: Payer: 59 | Admitting: Certified Registered Nurse Anesthetist

## 2023-05-14 ENCOUNTER — Encounter
Admission: RE | Disposition: A | Discharge status: Home / Self Care | Payer: Self-pay | Source: Home / Self Care | Attending: Gastroenterology

## 2023-05-14 ENCOUNTER — Ambulatory Visit
Admission: RE | Admit: 2023-05-14 | Discharge: 2023-05-14 | Disposition: A | Discharge status: Home / Self Care | Payer: 59 | Attending: Gastroenterology | Admitting: Gastroenterology

## 2023-05-14 DIAGNOSIS — D120 Benign neoplasm of cecum: Secondary | ICD-10-CM | POA: Insufficient documentation

## 2023-05-14 DIAGNOSIS — K635 Polyp of colon: Secondary | ICD-10-CM

## 2023-05-14 DIAGNOSIS — Z1211 Encounter for screening for malignant neoplasm of colon: Secondary | ICD-10-CM | POA: Insufficient documentation

## 2023-05-14 DIAGNOSIS — Z860100 Personal history of colon polyps, unspecified: Secondary | ICD-10-CM

## 2023-05-14 DIAGNOSIS — K5730 Diverticulosis of large intestine without perforation or abscess without bleeding: Secondary | ICD-10-CM | POA: Insufficient documentation

## 2023-05-14 DIAGNOSIS — J45909 Unspecified asthma, uncomplicated: Secondary | ICD-10-CM | POA: Diagnosis not present

## 2023-05-14 HISTORY — PX: COLONOSCOPY WITH PROPOFOL: SHX5780

## 2023-05-14 HISTORY — PX: POLYPECTOMY: SHX5525

## 2023-05-14 SURGERY — COLONOSCOPY WITH PROPOFOL
Anesthesia: General

## 2023-05-14 MED ORDER — PROPOFOL 10 MG/ML IV BOLUS
INTRAVENOUS | Status: DC | PRN
  Administered 2023-05-14: 60 mg via INTRAVENOUS

## 2023-05-14 MED ORDER — SODIUM CHLORIDE 0.9 % IV SOLN: INTRAVENOUS | Status: DC

## 2023-05-14 MED ORDER — LIDOCAINE HCL (PF) 2 % IJ SOLN
INTRAMUSCULAR | Status: AC
  Filled 2023-05-14: qty 5

## 2023-05-14 MED ORDER — PROPOFOL 500 MG/50ML IV EMUL
INTRAVENOUS | Status: DC | PRN
  Administered 2023-05-14: 160 ug/kg/min via INTRAVENOUS

## 2023-05-14 NOTE — Op Note (Signed)
[facility] Patient Name: Erica Weber Procedure Date: 05/14/2023 10:26 AM MRN: [id number] Account #: [id number] Date of Birth: Jun 17, 1970 Admit Type: Outpatient Age: 53 Room: [facility] ENDO ROOM 4 Gender: Female Note Status: Finalized Instrument Name: Peds Colonoscope [id number] Procedure:             Colonoscopy Indications:           Surveillance: History of adenomatous polyps,                         inadequate prep on last exam (<4yr), Last colonoscopy:                         December 2021 Providers:             Toney Reil MD, MD Medicines:             General Anesthesia Complications:         No immediate complications. Estimated blood loss: None. Procedure:             Pre-Anesthesia Assessment:                        - Prior to the procedure, a History and Physical was                         performed, and patient medications and allergies were                         reviewed. The patient is competent. The risks and                         benefits of the procedure and the sedation options and                         risks were discussed with the patient. All questions                         were answered and informed consent was obtained.                         Patient identification and proposed procedure were                         verified by the physician, the nurse, the                         anesthesiologist, the anesthetist and the technician                         in the pre-procedure area in the procedure room in the                         endoscopy suite. Mental Status Examination: alert and                         oriented. Airway Examination: normal oropharyngeal                         airway and neck mobility.  Respiratory Examination:                         clear to auscultation. CV Examination: normal.                         Prophylactic Antibiotics: The patient does not require                          prophylactic antibiotics. Prior Anticoagulants: The                         patient has taken no anticoagulant or antiplatelet                         agents. ASA Grade Assessment: II - A patient with mild                         systemic disease. After reviewing the risks and                         benefits, the patient was deemed in satisfactory                         condition to undergo the procedure. The anesthesia                         plan was to use general anesthesia. Immediately prior                         to administration of medications, the patient was                         re-assessed for adequacy to receive sedatives. The                         heart rate, respiratory rate, oxygen saturations,                         blood pressure, adequacy of pulmonary ventilation, and                         response to care were monitored throughout the                         procedure. The physical status of the patient was                         re-assessed after the procedure.                        After obtaining informed consent, the colonoscope was                         passed under direct vision. Throughout the procedure,                         the patient's blood pressure, pulse, and oxygen  saturations were monitored continuously. The                         Colonoscope was introduced through the anus and                         advanced to the the cecum, identified by appendiceal                         orifice and ileocecal valve. The colonoscopy was                         performed without difficulty. The patient tolerated                         the procedure well. The quality of the bowel                         preparation was evaluated using the BBPS [facility] Bowel                         Preparation Scale) with scores of: Right Colon = 3,                         Transverse Colon = 3 and Left Colon = 3 (entire mucosa                          seen well with no residual staining, small fragments                         of stool or opaque liquid). The total BBPS score                         equals 9. The ileocecal valve, appendiceal orifice,                         and rectum were photographed. Findings:      The perianal and digital rectal examinations were normal. Pertinent       negatives include normal sphincter tone and no palpable rectal lesions.      A 5 mm polyp was found in the cecum. The polyp was sessile. The polyp       was removed with a cold snare. Resection and retrieval were complete.       Estimated blood loss: none.      Multiple diverticula were found in the recto-sigmoid colon.      The retroflexed view of the distal rectum and anal verge was normal and       showed no anal or rectal abnormalities. Impression:            - One 5 mm polyp in the cecum, removed with a cold                         snare. Resected and retrieved.                        - Diverticulosis in the recto-sigmoid colon.                        -  The distal rectum and anal verge are normal on                         retroflexion view. Recommendation:        - Discharge patient to home (with escort).                        - Resume previous diet today.                        - Continue present medications.                        - Await pathology results.                        - Repeat colonoscopy in 5 years for surveillance. Procedure Code(s):     --- Professional ---                        [phone], Colonoscopy, flexible; with removal of                         tumor(s), polyp(s), or other lesion(s) by snare                         technique Diagnosis Code(s):     --- Professional ---                        Z86.010, Personal history of colonic polyps                        D12.0, Benign neoplasm of cecum                        K57.30, Diverticulosis of large intestine without                         perforation or abscess without  bleeding CPT copyright 2022 American Medical Association. All rights reserved. The codes documented in this report are preliminary and upon coder review may  be revised to meet current compliance requirements. Dr. Libby Maw Toney Reil MD, MD 05/14/2023 11:02:46 AM This report has been signed electronically. Number of Addenda: 0 Note Initiated On: 05/14/2023 10:26 AM Scope Withdrawal Time: 0 hours 11 minutes 53 seconds  Total Procedure Duration: 0 hours 16 minutes 15 seconds  Estimated Blood Loss:  Estimated blood loss: none.      [facility] ([facility])

## 2023-05-14 NOTE — H&P (Signed)
Arlyss Repress, MD [address]  [address]  [place], [place] [address]  Main: [phone]  Fax: [phone] Pager: [phone]  Primary Care Physician:  Enid Baas, MD Primary Gastroenterologist:  Dr. Arlyss Repress  Pre-Procedure History & Physical: HPI:  Erica Weber is a 53 y.o. female is here for an colonoscopy.   Past Medical History:  Diagnosis Date   Allergy    Asthma    Dysplastic nevus 04/27/2017   Right leg, left shoulder, left thigh, sees dermatologist, Dr. Jesusita Oka   Heart murmur    Palpitations     Past Surgical History:  Procedure Laterality Date   BREAST BIOPSY Right 03/06/2016   benign   BREAST SURGERY Bilateral 2000   Breast Reduction   CHOLECYSTECTOMY  1999   COLONOSCOPY WITH PROPOFOL N/A 03/24/2020   Procedure: COLONOSCOPY WITH PROPOFOL;  Surgeon: Pasty Spillers, MD;  Location: [facility] ENDOSCOPY;  Service: Endoscopy;  Laterality: N/A;   LIPOSUCTION  2012   Fat deposit in back   REDUCTION MAMMAPLASTY Bilateral    1999    Prior to Admission medications   Medication Sig Start Date End Date Taking? Authorizing Provider  cetirizine (ZYRTEC) 10 MG chewable tablet Chew 10 mg by mouth daily.    [provider]  cholecalciferol (VITAMIN D3) 25 MCG (1000 UNIT) tablet Take 1,000 Units by mouth daily.    [provider]  estradiol (ESTRACE) 0.1 MG/GM vaginal cream Use one vaginal applicator QHS x 2 weeks and then 2x weekly 01/02/20   Danelle Berry, PA-C  fluticasone (FLONASE) 50 MCG/ACT nasal spray Place 1 spray into both nostrils daily.    [provider]  montelukast (SINGULAIR) 10 MG tablet Take 10 mg by mouth at bedtime.     [provider]  Olopatadine HCl (PATADAY OP) Apply 1 drop to eye as needed.    [provider]  vitamin B-12 (CYANOCOBALAMIN) 100 MCG tablet Take 100 mcg by mouth daily.    [provider]    Allergies as of 04/20/2023   (No Known Allergies)     Family History  Problem Relation Age of Onset   Cancer Mother        lung   Diabetes Mother    Stroke Father    Hyperlipidemia Father    Down syndrome Son    Breast cancer Maternal Grandmother        69s   Breast cancer Paternal Grandmother        57s   Breast cancer Other    Cancer Maternal Aunt        ovar vs uterine - lung ca 2020    Social History   Socioeconomic History   Marital status: Married    Spouse name: sanuel   Number of children: 1   Years of education: 12   Highest education level: Master's degree (e.g., MA, MS, MEng, MEd, MSW, MBA)  Occupational History   Not on file  Tobacco Use   Smoking status: Never   Smokeless tobacco: Never  Vaping Use   Vaping status: Never Used  Substance and Sexual Activity   Alcohol use: Yes    Alcohol/week: 3.0 - 4.0 standard drinks of alcohol    Types: 3 - 4 Standard drinks or equivalent per week   Drug use: No   Sexual activity: Yes    Partners: Male    Birth control/protection: None  Other Topics Concern   Not on file  Social History Narrative  Not on file   Social Drivers of Health   Financial Resource Strain: Low Risk  (02/27/2023)   Received from [facility]   Overall Financial Resource Strain (CARDIA)    Difficulty of Paying Living Expenses: Not hard at all  Food Insecurity: No Food Insecurity (02/27/2023)   Received from [facility]   Hunger Vital Sign    Worried About Running Out of Food in the Last Year: Never true    Ran Out of Food in the Last Year: Never true  Transportation Needs: No Transportation Needs (02/27/2023)   Received from [facility] - Transportation    In the past 12 months, has lack of transportation kept you from medical appointments or from getting medications?: No    Lack of Transportation (Non-Medical): No  Physical Activity: Inactive (05/03/2018)   Exercise Vital Sign    Days of Exercise per Week: 0 days     Minutes of Exercise per Session: 0 min  Stress: No Stress Concern Present (05/03/2018)   [employer] of Occupational Health - Occupational Stress Questionnaire    Feeling of Stress : Not at all  Social Connections: Socially Integrated (05/03/2018)   Social Connection and Isolation Panel [NHANES]    Frequency of Communication with Friends and Family: Twice a week    Frequency of Social Gatherings with Friends and Family: Twice a week    Attends Religious Services: More than 4 times per year    Active Member of [employer] or Organizations: Yes    Attends Engineer, structural: More than 4 times per year    Marital Status: Married  Catering manager Violence: Not At Risk (05/03/2018)   Humiliation, Afraid, Rape, and Kick questionnaire    Fear of Current or Ex-Partner: No    Emotionally Abused: No    Physically Abused: No    Sexually Abused: No    Review of Systems: See HPI, otherwise negative ROS  Physical Exam: BP 124/78   Pulse 80   Temp (!) 97 F (36.1 C) (Temporal)   Resp 18   Ht 5' 5.5" (1.664 m)   Wt 72.1 kg   LMP 01/01/2018   SpO2 100%   BMI 26.06 kg/m  General:   Alert,  pleasant and cooperative in NAD Head:  Normocephalic and atraumatic. Neck:  Supple; no masses or thyromegaly. Lungs:  Clear throughout to auscultation.    Heart:  Regular rate and rhythm. Abdomen:  Soft, nontender and nondistended. Normal bowel sounds, without guarding, and without rebound.   Neurologic:  Alert and  oriented x4;  grossly normal neurologically.  Impression/Plan: Erica Weber is here for an colonoscopy to be performed for h/o colon polyps  Risks, benefits, limitations, and alternatives regarding  colonoscopy have been reviewed with the patient.  Questions have been answered.  All parties agreeable.   Lannette Donath, MD  05/14/2023, 9:56 AM

## 2023-05-14 NOTE — Anesthesia Postprocedure Evaluation (Signed)
Anesthesia Post Note  Patient: Erica Weber  Procedure(s) Performed: COLONOSCOPY WITH PROPOFOL POLYPECTOMY  Patient location during evaluation: Endoscopy Anesthesia Type: General Level of consciousness: awake and alert Pain management: pain level controlled Vital Signs Assessment: post-procedure vital signs reviewed and stable Respiratory status: spontaneous breathing, nonlabored ventilation, respiratory function stable and patient connected to nasal cannula oxygen Cardiovascular status: blood pressure returned to baseline and stable Postop Assessment: no apparent nausea or vomiting Anesthetic complications: no  No notable events documented.   Last Vitals:  Vitals:   05/14/23 1112 05/14/23 1122  BP: 100/70 104/69  Pulse:  77  Resp:  12  Temp:    SpO2:  100%    Last Pain:  Vitals:   05/14/23 1122  TempSrc:   PainSc: 0-No pain                 Stephanie Coup

## 2023-05-14 NOTE — Transfer of Care (Signed)
Immediate Anesthesia Transfer of Care Note  Patient: Erica Weber  Procedure(s) Performed: COLONOSCOPY WITH PROPOFOL POLYPECTOMY  Patient Location: PACU  Anesthesia Type:General  Level of Consciousness: drowsy  Airway & Oxygen Therapy: Patient Spontanous Breathing  Post-op Assessment: Report given to RN and Post -op Vital signs reviewed and stable  Post vital signs: Reviewed and stable  Last Vitals:  Vitals Value Taken Time  BP 87/46 05/14/23 1102  Temp    Pulse 85 05/14/23 1102  Resp 15 05/14/23 1102  SpO2 97 % 05/14/23 1102  Vitals shown include unfiled device data.  Last Pain:  Vitals:   05/14/23 0939  TempSrc: Temporal  PainSc: 0-No pain         Complications: No notable events documented.

## 2023-05-14 NOTE — Anesthesia Preprocedure Evaluation (Signed)
Anesthesia Evaluation  Patient identified by MRN, date of birth, ID band Patient awake    Reviewed: Allergy & Precautions, H&P , NPO status , Patient's Chart, lab work & pertinent test results, reviewed documented beta blocker date and time   Airway Mallampati: II   Neck ROM: full    Dental  (+) Teeth Intact   Pulmonary asthma    Pulmonary exam normal        Cardiovascular Exercise Tolerance: Good Normal cardiovascular exam+ Valvular Problems/Murmurs  Rhythm:regular Rate:Normal     Neuro/Psych negative neurological ROS  negative psych ROS   GI/Hepatic negative GI ROS, Neg liver ROS,,,  Endo/Other  negative endocrine ROS    Renal/GU      Musculoskeletal   Abdominal   Peds  Hematology negative hematology ROS (+)   Anesthesia Other Findings Past Medical History: No date: Allergy No date: Asthma 04/27/2017: Dysplastic nevus     Comment:  Right leg, left shoulder, left thigh, sees               dermatologist, Dr. Jesusita Oka No date: Heart murmur Past Surgical History: 03/06/2016: BREAST BIOPSY; Right     Comment:  benign 2000: BREAST SURGERY; Bilateral     Comment:  Breast Reduction 1999: CHOLECYSTECTOMY 2012: LIPOSUCTION     Comment:  Fat deposit in back No date: REDUCTION MAMMAPLASTY; Bilateral     Comment:  1999 BMI    Body Mass Index: 23.57 kg/m     Reproductive/Obstetrics negative OB ROS                             Anesthesia Physical Anesthesia Plan  ASA: 2  Anesthesia Plan: General   Post-op Pain Management: Minimal or no pain anticipated   Induction: Intravenous  PONV Risk Score and Plan: 3 and Propofol infusion, TIVA and Ondansetron  Airway Management Planned: Nasal Cannula  Additional Equipment: None  Intra-op Plan:   Post-operative Plan:   Informed Consent: I have reviewed the patients History and Physical, chart, labs and discussed the procedure  including the risks, benefits and alternatives for the proposed anesthesia with the patient or authorized representative who has indicated his/her understanding and acceptance.     Dental advisory given  Plan Discussed with: CRNA and Surgeon  Anesthesia Plan Comments: (Discussed risks of anesthesia with patient, including possibility of difficulty with spontaneous ventilation under anesthesia necessitating airway intervention, PONV, and rare risks such as cardiac or respiratory or neurological events, and allergic reactions. Discussed the role of CRNA in patient's perioperative care. Patient understands.)       Anesthesia Quick Evaluation

## 2023-05-14 NOTE — Anesthesia Procedure Notes (Signed)
Date/Time: 05/14/2023 10:36 AM  Performed by: Malva Cogan, CRNAPre-anesthesia Checklist: Patient identified, Emergency Drugs available, Suction available, Patient being monitored and Timeout performed Patient Re-evaluated:Patient Re-evaluated prior to induction Oxygen Delivery Method: Nasal cannula Induction Type: IV induction Placement Confirmation: CO2 detector and positive ETCO2

## 2023-05-15 ENCOUNTER — Encounter: Payer: Self-pay | Admitting: Gastroenterology

## 2023-05-15 LAB — SURGICAL PATHOLOGY

## 2023-05-17 ENCOUNTER — Encounter: Payer: Self-pay | Admitting: Gastroenterology

## 2023-09-12 ENCOUNTER — Other Ambulatory Visit: Payer: Self-pay | Admitting: Obstetrics & Gynecology

## 2023-09-12 DIAGNOSIS — Z1231 Encounter for screening mammogram for malignant neoplasm of breast: Secondary | ICD-10-CM

## 2023-09-14 ENCOUNTER — Ambulatory Visit
Admission: RE | Admit: 2023-09-14 | Discharge: 2023-09-14 | Disposition: A | Discharge status: Home / Self Care | Source: Ambulatory Visit | Attending: Obstetrics & Gynecology | Admitting: Obstetrics & Gynecology

## 2023-09-14 DIAGNOSIS — Z1231 Encounter for screening mammogram for malignant neoplasm of breast: Secondary | ICD-10-CM

## 2023-09-21 ENCOUNTER — Other Ambulatory Visit: Payer: Self-pay | Admitting: Obstetrics & Gynecology

## 2023-09-21 DIAGNOSIS — R928 Other abnormal and inconclusive findings on diagnostic imaging of breast: Secondary | ICD-10-CM

## 2023-09-24 ENCOUNTER — Ambulatory Visit
Admission: RE | Admit: 2023-09-24 | Discharge: 2023-09-24 | Disposition: A | Discharge status: Home / Self Care | Source: Ambulatory Visit | Attending: Obstetrics & Gynecology | Admitting: Obstetrics & Gynecology

## 2023-09-24 ENCOUNTER — Ambulatory Visit

## 2023-09-24 DIAGNOSIS — R928 Other abnormal and inconclusive findings on diagnostic imaging of breast: Secondary | ICD-10-CM

## 2023-10-04 ENCOUNTER — Encounter

## 2023-10-04 ENCOUNTER — Other Ambulatory Visit

## 2024-03-05 ENCOUNTER — Other Ambulatory Visit: Payer: Self-pay | Admitting: Internal Medicine

## 2024-03-05 DIAGNOSIS — Z0000 Encounter for general adult medical examination without abnormal findings: Secondary | ICD-10-CM

## 2024-03-05 DIAGNOSIS — J320 Chronic maxillary sinusitis: Secondary | ICD-10-CM
